# Patient Record
Sex: Female | Born: 1935 | Race: White | Hispanic: No | Smoking: Never smoker
Health system: Southern US, Community
[De-identification: ages and names within clinical notes are randomized; demographics above are authoritative.]

## PROBLEM LIST (undated history)

## (undated) DIAGNOSIS — G4733 Obstructive sleep apnea (adult) (pediatric): Secondary | ICD-10-CM

## (undated) DIAGNOSIS — E785 Hyperlipidemia, unspecified: Secondary | ICD-10-CM

## (undated) DIAGNOSIS — E039 Hypothyroidism, unspecified: Secondary | ICD-10-CM

## (undated) DIAGNOSIS — I10 Essential (primary) hypertension: Secondary | ICD-10-CM

## (undated) HISTORY — DX: Hypothyroidism, unspecified: E03.9

## (undated) HISTORY — PX: TONSILLECTOMY AND ADENOIDECTOMY: SUR1326

## (undated) HISTORY — DX: Hyperlipidemia, unspecified: E78.5

## (undated) HISTORY — PX: APPENDECTOMY: SHX54

## (undated) HISTORY — DX: Obstructive sleep apnea (adult) (pediatric): G47.33

## (undated) HISTORY — PX: PACEMAKER PLACEMENT: SHX43

## (undated) HISTORY — PX: VESICOVAGINAL FISTULA CLOSURE W/ TAH: SUR271

## (undated) HISTORY — PX: CHOLECYSTECTOMY: SHX55

---

## 2001-06-23 ENCOUNTER — Emergency Department (HOSPITAL_COMMUNITY): Admission: EM | Admit: 2001-06-23 | Discharge: 2001-06-23 | Payer: Self-pay | Admitting: *Deleted

## 2001-06-23 ENCOUNTER — Encounter: Payer: Self-pay | Admitting: Emergency Medicine

## 2001-09-13 ENCOUNTER — Ambulatory Visit (HOSPITAL_COMMUNITY): Admission: RE | Admit: 2001-09-13 | Discharge: 2001-09-14 | Payer: Self-pay | Admitting: Cardiology

## 2001-09-20 ENCOUNTER — Inpatient Hospital Stay (HOSPITAL_COMMUNITY): Admission: EM | Admit: 2001-09-20 | Discharge: 2001-10-03 | Payer: Self-pay | Admitting: *Deleted

## 2001-09-20 ENCOUNTER — Encounter: Payer: Self-pay | Admitting: *Deleted

## 2001-09-24 ENCOUNTER — Encounter: Payer: Self-pay | Admitting: Surgery

## 2001-09-25 ENCOUNTER — Encounter: Payer: Self-pay | Admitting: Surgery

## 2001-09-26 ENCOUNTER — Encounter: Payer: Self-pay | Admitting: Surgery

## 2001-09-27 ENCOUNTER — Encounter: Payer: Self-pay | Admitting: Surgery

## 2001-09-28 ENCOUNTER — Encounter: Payer: Self-pay | Admitting: Surgery

## 2001-09-29 ENCOUNTER — Encounter: Payer: Self-pay | Admitting: *Deleted

## 2001-10-18 ENCOUNTER — Ambulatory Visit (HOSPITAL_COMMUNITY): Admission: RE | Admit: 2001-10-18 | Discharge: 2001-10-18 | Payer: Self-pay | Admitting: Cardiology

## 2001-10-18 ENCOUNTER — Encounter: Payer: Self-pay | Admitting: Cardiology

## 2001-11-18 ENCOUNTER — Emergency Department (HOSPITAL_COMMUNITY): Admission: EM | Admit: 2001-11-18 | Discharge: 2001-11-18 | Payer: Self-pay | Admitting: Internal Medicine

## 2001-11-18 ENCOUNTER — Encounter: Payer: Self-pay | Admitting: Internal Medicine

## 2002-03-18 ENCOUNTER — Emergency Department (HOSPITAL_COMMUNITY): Admission: EM | Admit: 2002-03-18 | Discharge: 2002-03-18 | Payer: Self-pay | Admitting: Emergency Medicine

## 2002-03-18 ENCOUNTER — Encounter: Payer: Self-pay | Admitting: Emergency Medicine

## 2002-03-30 ENCOUNTER — Encounter: Payer: Self-pay | Admitting: Emergency Medicine

## 2002-03-30 ENCOUNTER — Emergency Department (HOSPITAL_COMMUNITY): Admission: EM | Admit: 2002-03-30 | Discharge: 2002-03-30 | Payer: Self-pay | Admitting: Emergency Medicine

## 2003-05-13 ENCOUNTER — Other Ambulatory Visit: Payer: Self-pay

## 2003-08-05 ENCOUNTER — Other Ambulatory Visit: Payer: Self-pay

## 2003-09-29 ENCOUNTER — Other Ambulatory Visit: Payer: Self-pay

## 2003-12-24 ENCOUNTER — Other Ambulatory Visit: Payer: Self-pay

## 2004-02-12 ENCOUNTER — Other Ambulatory Visit: Payer: Self-pay

## 2004-02-13 ENCOUNTER — Inpatient Hospital Stay: Payer: Self-pay | Admitting: Internal Medicine

## 2004-03-12 ENCOUNTER — Ambulatory Visit: Payer: Self-pay | Admitting: Internal Medicine

## 2004-03-31 ENCOUNTER — Other Ambulatory Visit: Payer: Self-pay

## 2004-03-31 ENCOUNTER — Emergency Department: Payer: Self-pay | Admitting: General Practice

## 2004-06-28 ENCOUNTER — Emergency Department: Payer: Self-pay | Admitting: Emergency Medicine

## 2004-07-07 ENCOUNTER — Ambulatory Visit: Payer: Self-pay | Admitting: Internal Medicine

## 2004-08-10 ENCOUNTER — Emergency Department: Payer: Self-pay | Admitting: Emergency Medicine

## 2004-08-21 ENCOUNTER — Ambulatory Visit: Payer: Self-pay | Admitting: Internal Medicine

## 2005-02-16 ENCOUNTER — Inpatient Hospital Stay: Payer: Self-pay | Admitting: Internal Medicine

## 2005-03-20 ENCOUNTER — Emergency Department: Payer: Self-pay | Admitting: Emergency Medicine

## 2005-04-04 ENCOUNTER — Emergency Department: Payer: Self-pay | Admitting: Emergency Medicine

## 2005-11-04 ENCOUNTER — Other Ambulatory Visit: Payer: Self-pay

## 2005-11-04 ENCOUNTER — Emergency Department: Payer: Self-pay | Admitting: General Practice

## 2006-02-15 ENCOUNTER — Ambulatory Visit: Payer: Self-pay | Admitting: Ophthalmology

## 2006-02-22 ENCOUNTER — Ambulatory Visit: Payer: Self-pay | Admitting: Ophthalmology

## 2006-04-26 ENCOUNTER — Other Ambulatory Visit: Payer: Self-pay

## 2006-04-26 ENCOUNTER — Emergency Department: Payer: Self-pay | Admitting: Emergency Medicine

## 2006-06-04 ENCOUNTER — Other Ambulatory Visit: Payer: Self-pay

## 2006-06-04 ENCOUNTER — Emergency Department: Payer: Self-pay

## 2006-06-09 ENCOUNTER — Ambulatory Visit: Payer: Self-pay

## 2006-06-20 ENCOUNTER — Inpatient Hospital Stay: Payer: Self-pay | Admitting: General Practice

## 2006-06-20 ENCOUNTER — Other Ambulatory Visit: Payer: Self-pay

## 2006-09-04 ENCOUNTER — Other Ambulatory Visit: Payer: Self-pay

## 2006-09-04 ENCOUNTER — Emergency Department: Payer: Self-pay | Admitting: Emergency Medicine

## 2006-09-05 ENCOUNTER — Emergency Department: Payer: Self-pay | Admitting: Emergency Medicine

## 2007-01-16 ENCOUNTER — Other Ambulatory Visit: Payer: Self-pay

## 2007-01-18 ENCOUNTER — Inpatient Hospital Stay: Payer: Self-pay | Admitting: Internal Medicine

## 2007-02-25 ENCOUNTER — Emergency Department: Payer: Self-pay | Admitting: Emergency Medicine

## 2007-02-25 ENCOUNTER — Other Ambulatory Visit: Payer: Self-pay

## 2007-05-20 ENCOUNTER — Emergency Department: Payer: Self-pay | Admitting: Emergency Medicine

## 2007-08-25 ENCOUNTER — Emergency Department: Payer: Self-pay | Admitting: Emergency Medicine

## 2007-10-01 ENCOUNTER — Emergency Department: Payer: Self-pay | Admitting: Emergency Medicine

## 2007-10-31 ENCOUNTER — Emergency Department: Payer: Self-pay | Admitting: Emergency Medicine

## 2008-03-02 ENCOUNTER — Emergency Department: Payer: Self-pay | Admitting: Internal Medicine

## 2008-04-21 ENCOUNTER — Inpatient Hospital Stay: Payer: Self-pay | Admitting: Internal Medicine

## 2008-04-24 ENCOUNTER — Inpatient Hospital Stay: Admission: RE | Admit: 2008-04-24 | Discharge: 2008-05-17 | Payer: Self-pay | Admitting: Internal Medicine

## 2008-05-10 ENCOUNTER — Ambulatory Visit (HOSPITAL_COMMUNITY): Admission: RE | Admit: 2008-05-10 | Discharge: 2008-05-10 | Payer: Self-pay | Admitting: Internal Medicine

## 2008-06-16 ENCOUNTER — Inpatient Hospital Stay: Payer: Self-pay | Admitting: Internal Medicine

## 2008-08-28 ENCOUNTER — Inpatient Hospital Stay: Payer: Self-pay | Admitting: Internal Medicine

## 2008-10-11 ENCOUNTER — Emergency Department: Payer: Self-pay | Admitting: Unknown Physician Specialty

## 2008-10-13 ENCOUNTER — Emergency Department: Payer: Self-pay | Admitting: Emergency Medicine

## 2008-10-18 ENCOUNTER — Emergency Department: Payer: Self-pay | Admitting: Emergency Medicine

## 2008-11-08 ENCOUNTER — Emergency Department: Payer: Self-pay | Admitting: Emergency Medicine

## 2008-11-13 ENCOUNTER — Emergency Department: Payer: Self-pay | Admitting: Emergency Medicine

## 2008-11-27 ENCOUNTER — Inpatient Hospital Stay: Payer: Self-pay | Admitting: Internal Medicine

## 2009-06-03 ENCOUNTER — Inpatient Hospital Stay: Payer: Self-pay | Admitting: Internal Medicine

## 2009-07-26 ENCOUNTER — Inpatient Hospital Stay: Payer: Self-pay | Admitting: Specialist

## 2009-08-28 ENCOUNTER — Emergency Department: Payer: Self-pay | Admitting: Emergency Medicine

## 2009-08-30 ENCOUNTER — Emergency Department: Payer: Self-pay | Admitting: Emergency Medicine

## 2009-09-12 ENCOUNTER — Ambulatory Visit: Payer: Self-pay | Admitting: Internal Medicine

## 2009-10-02 ENCOUNTER — Inpatient Hospital Stay: Payer: Self-pay | Admitting: Internal Medicine

## 2009-11-03 ENCOUNTER — Emergency Department: Payer: Self-pay | Admitting: Emergency Medicine

## 2009-11-07 ENCOUNTER — Emergency Department: Payer: Self-pay | Admitting: Emergency Medicine

## 2009-11-08 ENCOUNTER — Emergency Department: Payer: Self-pay | Admitting: Emergency Medicine

## 2009-12-07 ENCOUNTER — Emergency Department: Payer: Self-pay | Admitting: Emergency Medicine

## 2010-02-17 ENCOUNTER — Inpatient Hospital Stay: Payer: Self-pay | Admitting: Internal Medicine

## 2010-02-27 ENCOUNTER — Ambulatory Visit: Payer: Self-pay | Admitting: Cardiology

## 2010-04-17 ENCOUNTER — Inpatient Hospital Stay: Payer: Self-pay | Admitting: Internal Medicine

## 2010-06-06 ENCOUNTER — Encounter (HOSPITAL_COMMUNITY): Payer: Self-pay | Admitting: Radiology

## 2010-06-06 ENCOUNTER — Inpatient Hospital Stay (HOSPITAL_COMMUNITY)
Admission: EM | Admit: 2010-06-06 | Discharge: 2010-06-09 | DRG: 313 | Disposition: A | Discharge status: Home Health | Payer: Medicare Other | Attending: Internal Medicine | Admitting: Internal Medicine

## 2010-06-06 ENCOUNTER — Emergency Department (HOSPITAL_COMMUNITY): Payer: Medicare Other

## 2010-06-06 DIAGNOSIS — Z794 Long term (current) use of insulin: Secondary | ICD-10-CM

## 2010-06-06 DIAGNOSIS — Z7982 Long term (current) use of aspirin: Secondary | ICD-10-CM

## 2010-06-06 DIAGNOSIS — Z950 Presence of cardiac pacemaker: Secondary | ICD-10-CM

## 2010-06-06 DIAGNOSIS — I10 Essential (primary) hypertension: Secondary | ICD-10-CM | POA: Diagnosis present

## 2010-06-06 DIAGNOSIS — Z951 Presence of aortocoronary bypass graft: Secondary | ICD-10-CM

## 2010-06-06 DIAGNOSIS — Z79899 Other long term (current) drug therapy: Secondary | ICD-10-CM

## 2010-06-06 DIAGNOSIS — E669 Obesity, unspecified: Secondary | ICD-10-CM | POA: Diagnosis present

## 2010-06-06 DIAGNOSIS — I2510 Atherosclerotic heart disease of native coronary artery without angina pectoris: Secondary | ICD-10-CM | POA: Diagnosis present

## 2010-06-06 DIAGNOSIS — Z880 Allergy status to penicillin: Secondary | ICD-10-CM

## 2010-06-06 DIAGNOSIS — Z9181 History of falling: Secondary | ICD-10-CM

## 2010-06-06 DIAGNOSIS — E039 Hypothyroidism, unspecified: Secondary | ICD-10-CM | POA: Diagnosis present

## 2010-06-06 DIAGNOSIS — F411 Generalized anxiety disorder: Secondary | ICD-10-CM | POA: Diagnosis present

## 2010-06-06 DIAGNOSIS — Z882 Allergy status to sulfonamides status: Secondary | ICD-10-CM

## 2010-06-06 DIAGNOSIS — R0789 Other chest pain: Principal | ICD-10-CM | POA: Diagnosis present

## 2010-06-06 DIAGNOSIS — E785 Hyperlipidemia, unspecified: Secondary | ICD-10-CM | POA: Diagnosis present

## 2010-06-06 DIAGNOSIS — E1169 Type 2 diabetes mellitus with other specified complication: Secondary | ICD-10-CM | POA: Diagnosis present

## 2010-06-06 DIAGNOSIS — G4733 Obstructive sleep apnea (adult) (pediatric): Secondary | ICD-10-CM | POA: Diagnosis present

## 2010-06-06 HISTORY — DX: Essential (primary) hypertension: I10

## 2010-06-06 LAB — POCT I-STAT, CHEM 8
BUN: 16 mg/dL (ref 6–23)
Calcium, Ion: 1.1 mmol/L — ABNORMAL LOW (ref 1.12–1.32)
Chloride: 99 mEq/L (ref 96–112)
Creatinine, Ser: 1.1 mg/dL (ref 0.4–1.2)
Glucose, Bld: 67 mg/dL — ABNORMAL LOW (ref 70–99)
Hemoglobin: 10.9 g/dL — ABNORMAL LOW (ref 12.0–15.0)
Potassium: 4.1 mEq/L (ref 3.5–5.1)
Sodium: 137 mEq/L (ref 135–145)

## 2010-06-06 LAB — POCT CARDIAC MARKERS
CKMB, poc: <1 ng/mL — ABNORMAL LOW (ref 1.0–8.0)
Myoglobin, poc: 81 ng/mL (ref 12–200)

## 2010-06-06 LAB — DIFFERENTIAL
Basophils Relative: 0 % (ref 0–1)
Eosinophils Relative: 2 % (ref 0–5)
Lymphocytes Relative: 26 % (ref 12–46)
Lymphs Abs: 2.9 10*3/uL (ref 0.7–4.0)
Monocytes Absolute: 1.2 10*3/uL — ABNORMAL HIGH (ref 0.1–1.0)
Monocytes Relative: 11 % (ref 3–12)
Neutro Abs: 6.9 10*3/uL (ref 1.7–7.7)
Neutrophils Relative %: 61 % (ref 43–77)

## 2010-06-06 LAB — CBC
HCT: 33.2 % — ABNORMAL LOW (ref 36.0–46.0)
Hemoglobin: 10.8 g/dL — ABNORMAL LOW (ref 12.0–15.0)
WBC: 11.3 10*3/uL — ABNORMAL HIGH (ref 4.0–10.5)

## 2010-06-06 LAB — GLUCOSE, CAPILLARY
Glucose-Capillary: 68 mg/dL — ABNORMAL LOW (ref 70–99)
Glucose-Capillary: 82 mg/dL (ref 70–99)

## 2010-06-06 MED ORDER — IOHEXOL 300 MG/ML  SOLN
100.0000 mL | Freq: Once | INTRAMUSCULAR | Status: AC | PRN
  Administered 2010-06-06: 80 mL via INTRAVENOUS

## 2010-06-07 LAB — COMPREHENSIVE METABOLIC PANEL
ALT: 11 U/L (ref 0–35)
AST: 19 U/L (ref 0–37)
Calcium: 9.4 mg/dL (ref 8.4–10.5)
GFR calc Af Amer: >60 mL/min (ref >60)
Glucose, Bld: 178 mg/dL — ABNORMAL HIGH (ref 70–99)
Sodium: 139 mEq/L (ref 135–145)
Total Protein: 6 g/dL (ref 6.0–8.3)

## 2010-06-07 LAB — DIFFERENTIAL
Eosinophils Absolute: 0.4 10*3/uL (ref 0.0–0.7)
Eosinophils Relative: 3 % (ref 0–5)
Lymphs Abs: 3.8 10*3/uL (ref 0.7–4.0)
Monocytes Absolute: 1.1 10*3/uL — ABNORMAL HIGH (ref 0.1–1.0)

## 2010-06-07 LAB — CBC
HCT: 37.6 % (ref 36.0–46.0)
Hemoglobin: 12.7 g/dL (ref 12.0–15.0)
MCV: 95.7 fL (ref 78.0–100.0)
RBC: 3.93 MIL/uL (ref 3.87–5.11)
WBC: 11 10*3/uL — ABNORMAL HIGH (ref 4.0–10.5)

## 2010-06-07 LAB — LIPID PANEL
LDL Cholesterol: 67 mg/dL (ref 0–99)
Triglycerides: 70 mg/dL (ref <150)
VLDL: 14 mg/dL (ref 0–40)

## 2010-06-07 LAB — GLUCOSE, CAPILLARY: Glucose-Capillary: 270 mg/dL — ABNORMAL HIGH (ref 70–99)

## 2010-06-07 LAB — MAGNESIUM: Magnesium: 2.1 mg/dL (ref 1.5–2.5)

## 2010-06-07 LAB — CARDIAC PANEL(CRET KIN+CKTOT+MB+TROPI)
CK, MB: 1.2 ng/mL (ref 0.3–4.0)
Relative Index: INVALID (ref 0.0–2.5)
Total CK: 56 U/L (ref 7–177)
Total CK: 40 U/L (ref 7–177)

## 2010-06-07 LAB — HEMOGLOBIN A1C: Hgb A1c MFr Bld: 7.9 % — ABNORMAL HIGH (ref <5.7)

## 2010-06-07 LAB — MRSA PCR SCREENING: MRSA by PCR: POSITIVE — AB

## 2010-06-07 LAB — TSH: TSH: 7.176 u[IU]/mL — ABNORMAL HIGH (ref 0.350–4.500)

## 2010-06-08 ENCOUNTER — Inpatient Hospital Stay (HOSPITAL_COMMUNITY): Payer: Medicare Other

## 2010-06-08 LAB — GLUCOSE, CAPILLARY
Glucose-Capillary: 218 mg/dL — ABNORMAL HIGH (ref 70–99)
Glucose-Capillary: 274 mg/dL — ABNORMAL HIGH (ref 70–99)
Glucose-Capillary: 277 mg/dL — ABNORMAL HIGH (ref 70–99)

## 2010-06-08 MED ORDER — TECHNETIUM TC 99M TETROFOSMIN IV KIT
10.0000 | PACK | Freq: Once | INTRAVENOUS | Status: AC | PRN
  Administered 2010-06-08: 10 via INTRAVENOUS

## 2010-06-08 MED ORDER — TECHNETIUM TC 99M TETROFOSMIN IV KIT
30.0000 | PACK | Freq: Once | INTRAVENOUS | Status: AC | PRN
  Administered 2010-06-08: 30 via INTRAVENOUS

## 2010-06-09 ENCOUNTER — Other Ambulatory Visit (HOSPITAL_COMMUNITY): Payer: Self-pay

## 2010-06-13 LAB — CULTURE, BLOOD (ROUTINE X 2)
Culture: NO GROWTH
Culture: NO GROWTH

## 2010-06-14 NOTE — Discharge Summary (Signed)
Jasmine Berger, Jasmine Berger              ACCOUNT NO.:  [id number]  MEDICAL RECORD NO.:  [id number]           PATIENT TYPE:  I  LOCATION:  2011                         FACILITY:  [facility]  PHYSICIAN:  Priscella Donna I Iaan Oregel, MD      DATE OF BIRTH:  December 18, 1924  DATE OF ADMISSION:  06/06/2010 DATE OF DISCHARGE:  06/09/2010                              DISCHARGE SUMMARY   DIAGNOSES: 1. Atypical chest pain, status post a stress test which was negative. 2. Pulmonary embolism was ruled out. 3. Coronary artery disease, status post coronary artery bypass graft     in 2003. 4. History of complete heart block, status post pacemaker. 5. Insulin-dependent diabetes mellitus. 6. Obesity. 7. Status post right ankle fracture. 8. Hyperlipidemia. 9. Hypothyroidism. 10.History of chronic anxiety.  DISCHARGE MEDICATIONS: 1. Aspirin 81 mg p.o. daily. 2. Celexa 20 mg daily. 3. Duragesic patch 12.5 mcg every 3 days. 4. Hydrochlorothiazide 25 mg daily. 5. Imdur 60 mg daily. 6. Klonopin 0.5 mg everyday. 7. K-Dur 20 mEq p.o. daily. 8. Lisinopril 20 mg daily. 9. MiraLax 17 gram p.o. daily. 10.Protonix 40 mg p.o. daily. 11.Symbicort 1 puff twice daily. 12.Synthroid 175 mcg p.o. daily. 13.Thyronorm 50 mcg every 6 hours as needed. 14.Trazodone 100 mg every evening. 15.Tylenol. 16.Vitamin B12 one tab daily. 17.Vitamin D 1000 units every morning. 18.Zocor 20 mg p.o. daily.  CONSULTATIONS:  Cardiology consultation by Dr. Lynnea Ferrier.  PROCEDURES: 1. CT angio is negative for PE. 2. Chest x-ray; mediastinotomy, CABG, pacemaker, previous partial     compression fracture at the level of thoracic, age indeterminate,     myeloablative allogeneic. 3. Lexiscan Myoview reports no ischemia, EF more than 70%.  HISTORY OF PRESENT ILLNESS:  This is an 77 year old female with multiple medical problems including coronary artery disease, status post CABG; complete heart block, status post pacemaker; insulin-dependent  diabetes, who was recently moved from [facility] to [name].  She developed left-sided chest pain associated with shortness of breath, relieved with nitroglycerin received in the ambulance.  The patient admitted her pacemaker is interrogated in the last 3 to 6 months at [facility].  HOSPITAL COURSE: 1. Chest pain.  Transient episode of chest pain. 2. Cardiac enzymes were cycled, which were negative and CT angio was     negative for pulmonary embolism.  Cardiology was consulted because     of the patient's history of CABG and diabetes.  Lexiscan Myoview     was done during hospital stay, which did not show any evidence of     ischemia and EF was more than 70%, so cause of chest pain remains     atypical.  The patient has no further chest pain. 3. Diabetes mellitus.  Hemoglobin A1c found to be 7.9.  The patient     will be discharged with her insulin and further adjustment to be     done as outpatient. 4. Hypothyroidism with TSH of 7.176.  Synthroid dose was decreased to     175 mcg and further adjustment by her primary care physician.  Currently, we feel the patient is medically stable for discharge.  The patient  to resume her medications.     Katarzyna Wolven Bosie Helper, MD     HIE/MEDQ  D:  06/08/2010  T:  06/08/2010  Job:  [id number]  Electronically Signed by Ebony Cargo MD on 06/12/2010 06:55:47 PM

## 2010-06-14 NOTE — H&P (Signed)
NAME:  Jasmine Berger, Jasmine Berger              ACCOUNT NO.:  [id number]  MEDICAL RECORD NO.:  [id number]           PATIENT TYPE:  E  LOCATION:  [facility]                         FACILITY:  [facility]  PHYSICIAN:  Ty Buntrock I Aeon Kessner, MD      DATE OF BIRTH:  May 27, 1924  DATE OF ADMISSION:  06/06/2010 DATE OF DISCHARGE:                             HISTORY & PHYSICAL   CHIEF COMPLAINT:  Chest pain and shortness of breath for 1 day.  HISTORY OF PRESENT ILLNESS:  This is an 77 year old female with multiple medical problem including coronary artery disease status post CABG in 2003, complete heart block status post pacemaker, insulin- dependent diabetes mellitus, who was recently moved to [facility] after she lost her fiance in [facility].  This just happened in 2 weeks ago.  The patient today after she had hot lunch she experienced left-sided chest pain associated with shortness of breath.  The chest pain was 5/10, pain was not radiating, not associated with palpitation or sweating and as per patient currently the patient is pain free after she received nitroglycerin in the ambulance.  The patient also received aspirin 324.  Currently, the patient admitted she is pain free and no further shortness of breath.  The patient felt as if she was having heart attack.  The patient denies any similar episode in the past.  The patient admitted her pacemaker being previously interrogated every 3-6 months at [facility]  PAST MEDICAL HISTORY: 1. Insulin-dependent diabetes mellitus. 2. Hypertension. 3. Coronary artery disease status post CABG. 4. Bifascicular heart block status post permanent pacemaker. 5. Obesity. 6. Hyperlipidemia. 7. Hypothyroidism. 8. History of chronic anxiety.  PAST SURGICAL HISTORY:  Hysterectomy, appendectomy, and cholecystectomy.  SOCIA HISTORY:  She lives in the nursing home and denies any alcohol or cigarette abuse.  She has 2 children.  She is a  widow.  MEDICATIONS: 1. Melanex. 2. Zocor 20 mg. 3. Trazodone 100 mg p.o. at bedtime. 4. Klonopin 0.5 mg. 5. Symbicort. 6. Novolin N 25 units every morning. 7. Novolin 5 units at night. 8. Sertraline 200 mg by mouth every day. 9. Vitamin D 1000 units every day. 10.Vitamin B 12. 11.Oxygen continuous. 12.CPAP. 13.Potassium chloride 20 mEq. 14.Hydrochlorothiazide 25 mg. 15.Aspirin 81 mg p.o. daily. 16.Bextra 20 mg. 17.Protonix 40 mg. 18.Duragesic patch 18 mcg. 19.Lisinopril 20 mg p.o. daily. 20.Imdur 60 mg by mouth daily.  ALLERGIES33 :  PENICILLIN, PROZAC, and SULFA.  SYSTEMIC REVIEW:  All 12 points were asked and they were negative and the patient currently admitted she is feeling better and no further chest pain.  PHYSICAL EXAMINATION:  VITAL SIGNS:  Temperature 99.1, blood pressure 127/57, respiratory rate 17, and saturating 98% on room air. HEENT:  Normocephalic, atraumatic.  Pupils are equal and reactive to light and accommodation. Extraocular muscle movement was normal. NECK:  Supple.  No lymphadenopathy. HEART:  S1 and S2.  No added sound. LUNGS:  Normal vesicular breathing with equal air entry. ABDOMEN:  Soft, nontender.  Bowel sounds positive. EXTREMITIES:  Lower extremity, the patient had recent history of ankle fracture and she had a cuff in there but there  is no swelling and the patient's peripheral pulses were intact.  LABORATORY DATA:  Blood workup did show sodium 137, potassium 4.1, chloride 99, glucose 67, BUN 16, and creatinine 1.1.  White blood cells 11.3, hemoglobin 10.8, hematocrit 33.2, and platelets 257.  EKG, paced rhythm.  First set of cardiac enzyme was negative.  ASSESSMENT AND PLAN: 1. Chest pain and shortness of breath.  The patient has a history of     coronary artery bypass graft, multiple risk factor.  The patient     will be admitted to telemetry and we will place the patient on     aspirin, home medication.  We will cycle cardiac  enzyme.  We will     get 2-D echo.  We will ask Cardiology to see.  The patient may need     to have stress test during hospital stay versus cardiac     catheterization. 2. Low-grade fever with mild leukocytosis, possibly related to urinary     tract infection.  We will place the patient empirically on Cipro     IV.  We will get patient's urinalysis and culture. 3. Home medications.  The patient will review her home medications. 4. Hypoglycemia.  I will hold the Novolin N, place the patient on     Lantus 500 subcu nightly with insulin sensitive     sliding scale.  We may resume the patient's Novolin depending on     her reading during hospital stay.  Further recommendations as     hospital course progresses. 5. Deep venous thrombosis and gastrointestinal prophylaxis.     Canyon Lohr Bosie Helper, MD     HIE/MEDQ  D:  06/06/2010  T:  06/06/2010  Job:  [id number]  Electronically Signed by Ebony Cargo MD on 06/12/2010 06:55:43 PM

## 2010-07-07 ENCOUNTER — Emergency Department (HOSPITAL_COMMUNITY)
Admission: EM | Admit: 2010-07-07 | Discharge: 2010-07-07 | Disposition: A | Discharge status: Home / Self Care | Payer: Medicare Other | Source: Home / Self Care | Attending: Emergency Medicine | Admitting: Emergency Medicine

## 2010-07-07 DIAGNOSIS — Z794 Long term (current) use of insulin: Secondary | ICD-10-CM | POA: Insufficient documentation

## 2010-07-07 DIAGNOSIS — E1169 Type 2 diabetes mellitus with other specified complication: Principal | ICD-10-CM | POA: Insufficient documentation

## 2010-07-07 DIAGNOSIS — Z79899 Other long term (current) drug therapy: Secondary | ICD-10-CM | POA: Insufficient documentation

## 2010-07-07 DIAGNOSIS — R4182 Altered mental status, unspecified: Secondary | ICD-10-CM | POA: Insufficient documentation

## 2010-07-07 DIAGNOSIS — W19XXXA Unspecified fall, initial encounter: Secondary | ICD-10-CM | POA: Insufficient documentation

## 2010-07-07 DIAGNOSIS — Z950 Presence of cardiac pacemaker: Secondary | ICD-10-CM | POA: Insufficient documentation

## 2010-07-07 DIAGNOSIS — N390 Urinary tract infection, site not specified: Secondary | ICD-10-CM | POA: Insufficient documentation

## 2010-07-07 LAB — BASIC METABOLIC PANEL
BUN: 17 mg/dL (ref 6–23)
Calcium: 9.1 mg/dL (ref 8.4–10.5)
GFR calc non Af Amer: >60 mL/min (ref >60)
Glucose, Bld: 160 mg/dL — ABNORMAL HIGH (ref 70–99)
Potassium: 4.8 mEq/L (ref 3.5–5.1)
Sodium: 139 mEq/L (ref 135–145)

## 2010-07-07 LAB — URINALYSIS, ROUTINE W REFLEX MICROSCOPIC
Glucose, UA: NEGATIVE mg/dL
Nitrite: NEGATIVE
Specific Gravity, Urine: 1.013 (ref 1.005–1.030)
pH: 6.5 (ref 5.0–8.0)

## 2010-07-07 LAB — CK TOTAL AND CKMB (NOT AT ARMC)
CK, MB: 2.3 ng/mL (ref 0.3–4.0)
Relative Index: INVALID (ref 0.0–2.5)

## 2010-07-07 LAB — CBC
HCT: 35.4 % — ABNORMAL LOW (ref 36.0–46.0)
MCHC: 32.5 g/dL (ref 30.0–36.0)
Platelets: 220 10*3/uL (ref 150–400)
RDW: 12.5 % (ref 11.5–15.5)
WBC: 13.1 10*3/uL — ABNORMAL HIGH (ref 4.0–10.5)

## 2010-07-07 LAB — URINE MICROSCOPIC-ADD ON

## 2010-07-07 LAB — DIFFERENTIAL
Basophils Absolute: 0 10*3/uL (ref 0.0–0.1)
Eosinophils Absolute: 0 10*3/uL (ref 0.0–0.7)
Eosinophils Relative: 0 % (ref 0–5)
Monocytes Absolute: 1 10*3/uL (ref 0.1–1.0)

## 2010-07-07 LAB — GLUCOSE, CAPILLARY: Glucose-Capillary: 108 mg/dL — ABNORMAL HIGH (ref 70–99)

## 2010-07-07 LAB — TROPONIN I: Troponin I: 0.02 ng/mL (ref 0.00–0.06)

## 2010-07-08 ENCOUNTER — Observation Stay (HOSPITAL_COMMUNITY)
Admission: EM | Admit: 2010-07-08 | Discharge: 2010-07-08 | Disposition: A | Discharge status: Home / Self Care | Payer: Medicare Other | Attending: Emergency Medicine | Admitting: Emergency Medicine

## 2010-07-08 LAB — GLUCOSE, CAPILLARY
Glucose-Capillary: 88 mg/dL (ref 70–99)
Glucose-Capillary: 95 mg/dL (ref 70–99)
Glucose-Capillary: 50 mg/dL — ABNORMAL LOW (ref 70–99)
Glucose-Capillary: 141 mg/dL — ABNORMAL HIGH (ref 70–99)

## 2010-07-08 LAB — POCT I-STAT, CHEM 8
BUN: 15 mg/dL (ref 6–23)
Chloride: 104 mEq/L (ref 96–112)
Creatinine, Ser: 0.9 mg/dL (ref 0.4–1.2)
Sodium: 141 mEq/L (ref 135–145)

## 2010-08-17 LAB — GLUCOSE, CAPILLARY
Glucose-Capillary: 120 mg/dL — ABNORMAL HIGH (ref 70–99)
Glucose-Capillary: 134 mg/dL — ABNORMAL HIGH (ref 70–99)
Glucose-Capillary: 160 mg/dL — ABNORMAL HIGH (ref 70–99)
Glucose-Capillary: 168 mg/dL — ABNORMAL HIGH (ref 70–99)
Glucose-Capillary: 173 mg/dL — ABNORMAL HIGH (ref 70–99)
Glucose-Capillary: 182 mg/dL — ABNORMAL HIGH (ref 70–99)
Glucose-Capillary: 190 mg/dL — ABNORMAL HIGH (ref 70–99)
Glucose-Capillary: 190 mg/dL — ABNORMAL HIGH (ref 70–99)
Glucose-Capillary: 192 mg/dL — ABNORMAL HIGH (ref 70–99)
Glucose-Capillary: 201 mg/dL — ABNORMAL HIGH (ref 70–99)
Glucose-Capillary: 209 mg/dL — ABNORMAL HIGH (ref 70–99)
Glucose-Capillary: 212 mg/dL — ABNORMAL HIGH (ref 70–99)
Glucose-Capillary: 212 mg/dL — ABNORMAL HIGH (ref 70–99)
Glucose-Capillary: 214 mg/dL — ABNORMAL HIGH (ref 70–99)
Glucose-Capillary: 227 mg/dL — ABNORMAL HIGH (ref 70–99)
Glucose-Capillary: 230 mg/dL — ABNORMAL HIGH (ref 70–99)
Glucose-Capillary: 230 mg/dL — ABNORMAL HIGH (ref 70–99)
Glucose-Capillary: 231 mg/dL — ABNORMAL HIGH (ref 70–99)
Glucose-Capillary: 239 mg/dL — ABNORMAL HIGH (ref 70–99)
Glucose-Capillary: 245 mg/dL — ABNORMAL HIGH (ref 70–99)
Glucose-Capillary: 256 mg/dL — ABNORMAL HIGH (ref 70–99)
Glucose-Capillary: 271 mg/dL — ABNORMAL HIGH (ref 70–99)
Glucose-Capillary: 272 mg/dL — ABNORMAL HIGH (ref 70–99)
Glucose-Capillary: 273 mg/dL — ABNORMAL HIGH (ref 70–99)
Glucose-Capillary: 278 mg/dL — ABNORMAL HIGH (ref 70–99)
Glucose-Capillary: 345 mg/dL — ABNORMAL HIGH (ref 70–99)
Glucose-Capillary: 84 mg/dL (ref 70–99)

## 2010-08-24 ENCOUNTER — Encounter: Payer: Self-pay | Admitting: Pulmonary Disease

## 2010-08-24 ENCOUNTER — Ambulatory Visit (INDEPENDENT_AMBULATORY_CARE_PROVIDER_SITE_OTHER): Payer: Medicare FFS | Admitting: Pulmonary Disease

## 2010-08-24 VITALS — BP 150/80 | HR 70 | Temp 98.4°F | Ht 62.0 in | Wt 172.0 lb

## 2010-08-24 DIAGNOSIS — Z9989 Dependence on other enabling machines and devices: Secondary | ICD-10-CM

## 2010-08-24 DIAGNOSIS — G4733 Obstructive sleep apnea (adult) (pediatric): Secondary | ICD-10-CM | POA: Insufficient documentation

## 2010-08-24 NOTE — Patient Instructions (Signed)
We will take care of the paperwork for your CPAP machine after your sleep study

## 2010-08-24 NOTE — Assessment & Plan Note (Addendum)
obstructive sleep apnea is very likely & an overnight polysomnogram will be scheduled as a split study. The pathophysiology of obstructive sleep apnea , it's cardiovascular consequences & modes of treatment including CPAP were discused with the patient in detail & they evidenced understanding. We will send a copy of the study to her supplier & make changes to CPPA as needed We will also assess need for O2.

## 2010-08-24 NOTE — Progress Notes (Signed)
  Subjective:    Patient ID: Jasmine Berger, female    DOB: April 17, 1925, 77 y.o.   MRN: [id number]  HPI 77/F ,Assisted living reisdent presents for evaluation of obstructive sleep apnea  Had PSG 10 yrs ago at [facility] , AHI  'big number ', on CPAP since - replaced 1 yr ago, with full face mask , with 2 L O2 (based on ONO), DME- [facility] put her on symbicort  Takes 1 puff at night Takes celexa for depression & clonazpam 0.5 mg qd, fentanyl 12 mcg patch for arthritis pain ESS 6 Bedtime is 10pm, latency few minutes, 3-4 BR visits, oob at 0530, compliant with cpap, denies excessive somnolence or unintentional naps, no morning headaches.   Review of Systems  Constitutional: Positive for unexpected weight change. Negative for fever.  HENT: Positive for congestion, rhinorrhea and sinus pressure. Negative for ear pain, nosebleeds, sore throat, sneezing, trouble swallowing, dental problem and postnasal drip.   Eyes: Positive for itching. Negative for redness.  Respiratory: Positive for cough and shortness of breath. Negative for chest tightness and wheezing.   Cardiovascular: Positive for leg swelling. Negative for palpitations.  Gastrointestinal: Negative for nausea and vomiting.  Genitourinary: Negative for dysuria.  Musculoskeletal: Positive for joint swelling.  Skin: Negative for rash.  Neurological: Positive for headaches.  Hematological: Bruises/bleeds easily.  Psychiatric/Behavioral: Negative for dysphoric mood. The patient is not nervous/anxious.        Objective:   Physical Exam Gen. Pleasant, elderly,well-nourished, in no distress, normal affect, ambulates with walker ENT - no lesions, no post nasal drip Neck: No JVD, no thyromegaly, no carotid bruits Lungs: no use of accessory muscles, no dullness to percussion, clear without rales or rhonchi  Cardiovascular: Rhythm regular, heart sounds  normal, no murmurs or gallops, no peripheral edema Abdomen: soft and non-tender, no  hepatosplenomegaly, BS normal. Musculoskeletal: No deformities, no cyanosis or clubbing Neuro:  alert, non focal         Assessment & Plan:

## 2010-09-03 ENCOUNTER — Ambulatory Visit (HOSPITAL_BASED_OUTPATIENT_CLINIC_OR_DEPARTMENT_OTHER): Payer: Medicare FFS

## 2010-09-07 ENCOUNTER — Telehealth: Payer: Self-pay | Admitting: Pulmonary Disease

## 2010-09-07 NOTE — Telephone Encounter (Signed)
lmomtcb x1 for [employer]

## 2010-09-08 NOTE — Telephone Encounter (Signed)
lmomtcb x 2  

## 2010-09-09 NOTE — Telephone Encounter (Signed)
lmomtcb x 3  

## 2010-09-10 NOTE — Telephone Encounter (Signed)
We have attempted to contact x 3.  Per protocol, will sign off on this message and wait for Gaylyn Rong to call us back.

## 2010-09-18 NOTE — Consult Note (Signed)
[place]  Patient:    MACALA, BALDONADO Visit Number: [id number] MRN: [id number]          Service Type: MED Location: 2300 2310 01 Attending Physician:  Cleatrice Burke Dictated by:   Nathen May, M.D., [facility] Admit Date:  09/20/2001   CC:         Kari Baars, M.D.  [facility]  at [facility]  [place] attn. Device Clinic  Alleen Borne, M.D.   Consultation Report  REASON FOR CONSULTATION:  Thank you very much for asking me to see Mrs. Anastasio Champion in electrophysiological consultation for sinus bradycardia.  HISTORY OF PRESENT ILLNESS:  Mrs. Gorniak is a 77 year old widowed mother of two who has a complicated past psychiatric history.  She presented approximately two weeks ago to Dr. Maisie Fus C. Wall for evaluation of chest pain.  She was referred for catheterization that demonstrated patency at her prior PCI sites with a 70% proximal LAD, 70% distal lesion, 70% diagonal, 80% ramus, and the RCA was diffusely diseased.  There was also further disease in the circumflex as well.  LV function per Dr. Lestine Box addendum is normal.  At that time, CABG was recommended with PCI as an alternative.  The patient declined and was discharged on medical therapy.  Because of her refractory symptoms, she was readmitted to the hospital on Sep 20, 2001.  CVTS consultation has been obtained and CABG is anticipated.  However, while monitoring, the patient has had significant problems with sinus node dysfunction, pauses of two to three seconds associated with heart rates in the low 30s.  Some of these are related to blocked PACs and many of these otherwise represent simply sinus node slowing and/or arrest.  It does not appear to be sinus node exit block.  The patient has a complex related history of recurrent syncope.  This has occurred multiple times including twice in the last five months.  One of these was  preceded by micturition.  The other she does not recall, and in her prior assisted care facility, she had multiple episodes that occurred after she stood.  She has had episodes of prolonged unconsciousness.  This has particularly occurred after her husband died.  She recalls not remembering three days of event.  It was not clear to her physician whether this represented seizures or a psychogenic response to stress.  Her functional capacity is quite limited.  She lives in a nursing home and walks around a little bit.  She does not have resting shortness of breath.  PAST MEDICAL HISTORY:  Notable for hypertension and diabetes.  She is significant obese.  She has hyperlipidemia and has treated hypothyroidism. She also has chronic anxiety and takes anxiolytics frequently.  PAST SURGICAL HISTORY:  Notable for hysterectomy, appendectomy, and cholecystectomy.  SOCIAL HISTORY:  She lives in a nursing facility.  She denies any alcohol or cigarettes.  She has two children.  She is widowed.  She has a bird named Clinical biochemist.  FAMILY HISTORY:  Noncontributory.  REVIEW OF SYSTEMS:  Outlined on the intake note on Sep 20, 2001.  It is reviewed and is not expanded upon at this point.  MEDICATIONS:  1. Insulin.  2. Protonix 40 mg.  3. Synthroid 150 mcg.  4. Clonazepam 1 q.i.d.  5. Aspirin.  6. Somatostatin 40 mg q.d.  7. Celexa 40 mg q.d.  8. Avapro 300/25.  9. Amlodipine 5. 10. Ambien p.r.n. 11. Heparin IV.  ALLERGIES:  She is allergic to PENICILLIN, PROZAC, and SULFA.  PHYSICAL EXAMINATION:  GENERAL:  She is an elderly obese Caucasian female in no acute distress.  VITAL SIGNS:  Her blood pressure is in the range of 95 to 130 systolic with a diastolic of approximately 50.  Her heart rate ranges from the mid-30s to the mid-70s.  Her respirations are 18 and unlabored.  HEENT:  No scleral icterus nor xanthoma.  NECK:  Her neck veins were difficult to discern.  Her carotids were  brisk bilaterally without bruits.  BACK:  Without kyphosis or scoliosis.  LUNGS:  Clear.  HEART:  Heart sounds were regular.  Murmurs and gallops were not appreciated.  ABDOMEN:  Soft.  Protuberant.  Bowel sounds were present.  Organomegaly was not appreciated.  EXTREMITIES:  Without clubbing, cyanosis, or edema.  NEUROLOGICAL:  Grossly normal.  SKIN:  Warm and dry.  LABORATORY DATA:  Electrocardiogram demonstrates sinus rhythm at 60 with intervals of 0.19, 0.14, 0.51 with an access of -70 degrees.  Telemetry is as noted above.  IMPRESSION: 1. Sinus node dysfunction with pauses of greater than two seconds with heart    rates around 30 intermittently and persisting for minutes. 2. Coronary artery disease.    a. Three vessel.    b. Good left ventricular function.    c. Persistent chest pain now for coronary artery bypass graft. 3. History of syncope-orthostatic/postmicturation with other episodes of    transient loss of consciousness which may or may not be cardiovascular. 4. Right bundle branch block, left axis deviation. 5. Prior history of significant psychosocial stress with two hospitalizations    for depression including ECG and chronic anxiety. 6. Diabetes and hypertension for many years.  DISPOSITION:  Mrs. Watanabe has coronary artery disease with anticipated bypass surgery.  She had significant sinus node dysfunction and there are no drugs easily able to be implicated as the cause of this.  Given the episodes of heart rate in the 30s, even during waking hours, if these persists following surgery, I would recommend pacemaker implantation.   It is unlikely that this represents an ischemic manifestation but it would certainly be nice if revascularization were to assist [place] with this.  We will follow her.  Procedurally, her surgery unfortunately is not scheduled until next Wednesday.  Thank you very much for this consultation. Dictated by:   Nathen May,  M.D., [facility] Attending Physician:  Cleatrice Burke DD:  09/22/01 TD:  09/25/01 Job: [id number]

## 2010-09-18 NOTE — Op Note (Signed)
[place]. [facility]  Patient:    Jasmine Berger, Jasmine Berger Visit Number: [id number] MRN: [id number]          Service Type: MED Location: 2300 2310 01 Attending Physician:  Cleatrice Burke Dictated by:   Rudene Christians. Ladona Ridgel, M.D. [facility] Proc. Date: 09/28/01 Admit Date:  09/20/2001   CC:         Alleen Borne, M.D.  Kari Baars, M.D.  Kathrine Cords, R.N.  Luis Abed, M.D. [facility]   Operative Report  PROCEDURE PERFORMED:  Implantation of a dual-chamber pacemaker.  INDICATIONS:  Complete heart block.  I. Introduction.  The patient is a very pleasant 77 year old woman who has a history of bradycardia and underwent coronary artery bypass surgery for severe three-vessel coronary disease.  Postoperatively, she had initially atrial fibrillation with long pauses and then returned to sinus rhythm but had no underlying AV conduction, and she is now referred for permanent pacemaker implantation.  II. After informed consent was obtained, the patient was taken to the diagnostic KP lab in the fasting state.  After the usual preparation and draping, 30 cc of lidocaine was infiltrated into the left infraclavicular region.  A 7 cm incision was carried out over this region, and electrocautery was utilized to dissect down to the subpectoralis fascia.  20 cc of contrast was injected into the left upper extremity, demonstrating a patent left subclavian vein.  It was subsequently punctured x2, and the Medtronic model 5076 52 cm active fixation lead, serial #[id number] was inserted into the right ventricle, and the Medtronic model 5076 45 cm active fixation lead, serial #[id number] was advanced into the right atrium.  Mapping was carried out in the right ventricle, and the loop was actually fixed.  The final site R waves which were paced measured approximately 11 mV.  The patients threshold was 0.6 volts at 0.5 msec after the lead was actively fixed in the pacing impedance was  688 ohms.  With the ventricular lead satisfactorily positioned, mapping was carried out with the right atrial lead, and at the final site on the lateral wall at the right atrium, P waves measured 4 mV, and the pacing threshold was 1.3 volts or 0.5 msec with a pacing impedance of 900 ohms.  With the lead in satisfactory position, they were secured to the subpectoralis fascia with a figure-of-eight silk suture.  In addition, the sew-in sleeves were secured with silk suture.  Electrocautery was utilized to make a subcutaneous pocket.  At this point, the Medtronic Sigma [id number], dual-chamber pacemaker, serial [id number], was connected to the atrial and ventricular pacing leads and placed in the subcutaneous pocket. Kanamycin irrigation was utilized to irrigate the incision with fore and after pocket insertion.  The generator was secured with a silk suture.  The incision was then closed with a layer of 2-0 Vicryl, followed by a layer of 3-0 Vicryl, followed by a layer of 4-0 Vicryl.  Benzoin was painted on the skin.  Steri-Strips were applied, and a pressure dressing was in place, and the patient returned to her room in satisfactory condition.  III. Complications.  There were no immediate procedure complications.  IV. Results. This demonstrated successful implantation of the Medtronic dual-chamber pacing system with the patient with complete heart block and no underlying escape. Dictated by:   Rudene Christians. Ladona Ridgel, M.D. [facility] Attending Physician:  Cleatrice Burke DD:  09/28/01 TD:  09/29/01 Job: [id number]

## 2010-09-18 NOTE — Consult Note (Signed)
[place]. [facility]  Patient:    Jasmine Berger, Jasmine Berger Visit Number: [id number] MRN: [id number]          Service Type: MED Location: CCUB 2903 01 Attending Physician:  Talitha Givens Dictated by:   Alleen Borne, M.D. Proc. Date: 09/21/01 Admit Date:  09/20/2001   CC:         Luis Abed, M.D. [facility]   Consultation Report  REFERRING PHYSICIAN:  Luis Abed, M.D.  REASON FOR CONSULTATION:  Severe three-vessel coronary artery disease with unstable angina.  HISTORY OF PRESENT ILLNESS:  This patient is a 77 year old white female with a history of hypertension, diabetes, and hyperlipidemia as well as coronary artery disease status post two percutaneous interventions in the past. She recently underwent cardiac catheterization on Sep 13, 2001 after she presented with recurrent chest pain which showed severe three-vessel disease. The LAD had 70% proximal stenosis. There was 70% diagonal stenosis. There was 80% intermediate stenosis. The left circumflex had 70-80% stenosis. The right coronary artery was diffusely diseased up to 80%. Left ventricular function was well-preserved. At that time a coronary artery bypass graft surgery was recommended, but the patient declined surgery or percutaneous intervention. She has been at home at the [facility] where she lives in an assisted care situation. She reports having recurrent episodes of substernal chest pressure and heaviness associated with dyspnea. The episodes are moderately severe, and she has taken sublingual nitroglycerin with temporary relief. She has had progressive fatigue. She was admitted yesterday and ruled out for myocardial infarction.  PAST MEDICAL HISTORY:  1. Coronary artery disease as mentioned above. She is status post PTCA 15     years ago and two years ago.  2. History of hyperlipidemia.  3. History of hypertension.  4. History of diabetes x24 years.  5. History  of hypothyroidism.  6. History of syncope and has been told that she was having a seizure by     neurology.  7. History of hysterectomy.  8. History of appendectomy.  9. History of cholecystectomy.  HOME MEDICATIONS:  1. Humulin R 8 units q.a.m..  2. Humulin L 30 units q.a.m. and q.p.m.  3. Imdur 60 mg q.d.  4. Prilosec 20 mg q.d.  5. Levoxyl 150 mcg q.d.  6. Celexa 40 mg q.d.  7. Ambien 5 mg q.h.s.  8. Clonazepam 1 mg q.i.d.  9. Avalide 300/12.5 one q.d. 10. Aspirin one q.d. 11. Lipitor 20 mg q.d. 12. Plavix 75 mg q.d. 13. Bextra 10 mg q.d. 14. Sublingual nitroglycerin p.r.n.  REVIEW OF SYSTEMS:  CONSTITUTIONAL:  She denies fevers or chills. She has had no recent weight changes. EYES:  Negative. ENT:  Negative. ENDOCRINE:  She has diabetes and hypothyroidism. CARDIOVASCULAR:  As above. She has had chest pain and exertional dyspnea. She denies PND and orthopnea. She has had no peripheral edema. She has had occasional palpitations. RESPIRATORY:  She denies cough and sputum production. GI:  She had had no nausea and vomiting. She denies melena and bright red blood per rectum. GU:  She denies dysuria and hematuria. NEUROLOGICAL:  She has had no focal weakness or numbness. She has had a couple episodes of syncope over the past five months and has been followed by neurology for that. She was told that they may be due to seizure activity and started on medication for that without resolution. PSYCHIATRIC: She has a history of depression and anxiety. SKIN:  Negative. MUSCULOSKELETAL: She does have  some arthralgias.  SOCIAL HISTORY:  She lives at [facility] in an assisted care situation. She does not smoke and does not drink alcohol. She has two children and is widowed.  FAMILY HISTORY:  Positive for coronary artery disease with her father dying of congestive heart failure at age 23. Her mother died of stroke at age 18. She has two brothers that are deceased and one  had coronary artery bypass surgery.  PHYSICAL EXAMINATION:  VITAL SIGNS:  Blood pressure 107/53 and her pulse is 53 and regular, respiratory rate is 18 and nonlabored.  GENERAL:  She is an obese white female in no distress.  HEENT:  Normocephalic, atraumatic. The pupils are equal and reactive to light and accommodation. Extraocular muscles are intact. Her throat is clear.  NECK:  Normal carotid pulses bilaterally. There are no bruits. There is no adenopathy or thyromegaly.  CARDIAC:  Regular rate and rhythm with normal heart sounds. There are no murmurs, rubs or gallops.  LUNGS:  Clear.  ABDOMEN:  Active bowel sounds. Her abdomen is soft and obese. There are no palpable masses or organomegaly.  EXTREMITIES:  No peripheral edema. Pedal pulses are not palpable.  SKIN:  Warm and dry.  NEUROLOGICAL:  Alert and oriented x3. Motor and sensory exam is grossly normal.  LABORATORY AND ACCESSORY DATA:  Normal electrolytes with a glucose of 127, BUN of 15 and a creatinine of 1.0. Coagulation profile is normal. White blood cell count is 11.7 with a hemoglobin of 12.9 and platelet count of 269,000.  Chest x-ray showed cardiac enlargement with no active disease. Electrocardiogram showed normal sinus rhythm with a right bundle branch block and left anterior fascicular block. 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  IMPRESSION:  This patient has severe three-vessel coronary disease with unstable anginal symptoms that are progressive and marginally limiting her lifestyle. I agree that proceeding with coronary artery bypass graft surgery is the best treatment to prevent further ischemia and infarction. I have discussed the operative procedure with her including alternatives, benefits, and risks including bleeding, blood transfusion, infection, stroke, myocardial infarction, graft failure, and death. She understands and agrees to proceed.  She has been on Plavix and aspirin, and I would like to  discontinue the Plavix and wait until Wednesday of next week to perform surgery to minimize her bleeding complications and need for blood transfusion. If she develops recurrent chest pain symptoms, then we may need to proceed with surgery sooner. We will plan to obtain carotid Dopplers and lower extremity arterial Dopplers tomorrow. Dictated by:   Alleen Borne, M.D. Attending Physician:  Talitha Givens DD:  09/21/01 TD:  09/24/01 Job: [id number]

## 2010-09-18 NOTE — Cardiovascular Report (Signed)
[place]. [facility]  Patient:    Jasmine Berger, Jasmine Berger Visit Number: [id number] MRN: [id number]          Service Type: CAT Location: 3700 [id number] Attending Physician:  Ronaldo Miyamoto Dictated by:   Arturo Morton Riley Kill, M.D. [facility] Proc. Date: 09/13/01 Admit Date:  09/13/2001 Discharge Date: 09/14/2001   CC:         Kari Baars, M.D.  Thomas C. Wall, M.D. [facility]  CV Laboratory   Cardiac Catheterization  PROCEDURE:  Cardiac catheterization.  CARDIOLOGIST:  Arturo Morton. Riley Kill, M.D. [facility]  INDICATIONS:  Ms. Forstrom is a 77 year old who lives in assisted living facility.  She has had a history of known coronary artery disease with prior catheterization in [place], [place].  She presents now with chest pain and exertional fatigue.  Current study is done to assess coronary anatomy.  PROCEDURES: 1. Left heart catheterization. 2. Selective coronary arteriography. 3. Selective left ventriculography. 4. Subclavian angiography.  DESCRIPTION OF PROCEDURE:  The procedure was performed from the right femoral artery using 6 French catheter.  She tolerated the procedure without complications.  She was taken to the holding area in satisfactory condition.  HEMODYNAMIC DATA:  Central aortic pressure 132/56.  Left ventricle 143/1/13. No significant gradient on pullback across the aortic valve.  There was an initial gradient and then when rechecked was no longer present.  It was easy to cross the valve in an antegrade fashion.  ANGIOGRAPHIC DATA:  The left main coronary artery is free of critical disease. disease.  The left anterior descending artery is segmentally diseased in the proximal portion overlapping the origin of the diagonal.  There is about 70-75% narrowing leading into the diagonal.  The diagonal itself has about 70% ostial narrowing.  There is a 70% stenosis distally, and there is some typical diabetic changes in the distal vessel.  The  diagonal and LAD both appear to be graftable.  The ramus intermedius is apparently a large vessel with diffuse segmental 80% narrowing proximally.  The circumflex provides a marginal branch which has about 40% ostial narrowing.  The AV circumflex distally has tandem lesions of 70-80%.  The right coronary artery has a marked anterior takeoff.  There was about 50-60% narrowing at the ostium followed by tandem lesions of about 75 or 80% in the proximal portion of the mid vessel.  Distally, there is some diffuse disease with 50% tandem lesions, and the proximal portion of the PDA has some ostial disease that appears to be about 80%.  The posterior lateral system has some luminal irregularities but is without critical disease.  The origin of the subclavian does have about 30% narrowing.  The internal mammary is patent.  VENTRICULOGRAPHY:  The ventriculography in the RAO projection reveals vigorous global systolic function.  There is trace mitral regurgitation noted.  CONCLUSIONS: 1. Preserved overall left ventricular function. 2. Progression of disease involving the left anterior descending,    intermediate, and right coronary artery as described in the above    text. 3. Patent subclavian and internal mammary.  DISPOSITION:  We plan to get a surgical consult.  The patient may require revascularization surgery as she is progressive symptomatic. Dictated by:   Arturo Morton Riley Kill, M.D. [facility] Attending Physician:  Ronaldo Miyamoto DD:  09/13/01 TD:  09/14/01 Job: [id number]

## 2010-09-18 NOTE — Op Note (Signed)
[place]. [facility]  Patient:    Jasmine Berger, Jasmine Berger Visit Number: [id number] MRN: [id number]          Service Type: MED Location: 2300 2310 01 Attending Physician:  Cleatrice Burke Dictated by:   Alleen Borne, M.D. Proc. Date: 09/25/01 Admit Date:  09/20/2001   CC:         Cath lab  [facility]   Operative Report  PREOPERATIVE DIAGNOSIS:  Severe three-vessel coronary artery disease with unstable angina.  POSTOPERATIVE DIAGNOSIS:  Severe three-vessel coronary artery disease with unstable angina.  OPERATION PERFORMED:  Median sternotomy, extracorporeal circulation, coronary artery bypass graft surgery times six using a left internal mammary artery graft to the left anterior descending coronary artery, with a saphenous vein graft to the diagonal branch of the left anterior descending, a sequential saphenous vein graft to the intermediate, first obtuse marginal, and second obtuse marginal branches of the left circumflex coronary artery, and a saphenous vein graft to the posterior descending coronary artery.  Endovein harvest from the right thigh.  SURGEON:  Alleen Borne, M.D.  ASSISTANT: 1. Salvatore Decent. Cornelius Moras, M.D. 2. Adair Patter, P.A.  ANESTHESIA:  General endotracheal.  INDICATIONS FOR PROCEDURE:  The patient is a 77 year old white female with a history of diabetes mellitus and hypertension as well as a history of coronary disease status post two remote percutaneous interventions.  Recent catheterization on Sep 13, 2001 showed severe three-vessel coronary artery disease and coronary artery bypass surgery was recommended.  The patient declined at that time and was treated medically.  She is now readmitted with unstable angina and dyspnea.  She ruled out for myocardial infarction.  After review of the catheterization films, it was felt that coronary artery bypass surgery was the best treatment for the patient.  I discussed the  operative procedure with the patient and her daughter including alternatives to surgery, benefits, and risks including bleeding, possible blood transfusion, infection, stroke, myocardial infarction, and death.  They understood and agreed to proceed with surgery.  DESCRIPTION OF PROCEDURE:  The patient was taken to the operating room and placed on the table in supine position.  After induction of general endotracheal anesthesia, a Foley catheter was placed in the bladder using sterile technique.  Then the chest, abdomen and both lower extremities were prepped and draped in the usual sterile manner.  The chest was entered through a median sternotomy incision and the pericardium opened in the midline. Examination of the heart showed good ventricular contractility.  The ascending aorta had no palpable plaques in it.  Then the left internal mammary artery was harvested from the chest wall as a pedicle graft.  This was a medium caliber vessel with excellent blood flow through it.  At the same time a segment of greater saphenous vein was harvested from the right thigh using Endovein harvest technique.  This vein was of medium caliber and good quality.  In addition, a piece of vein was harvested from the right leg below the knee using open technique.  This vein was also of medium size and good quality.  Then the patient was heparinized and when an adequate activated clotting time was achieved, the distal ascending aorta was cannulated using a 20 French aortic cannula for arterial inflow.  Venous outflow was achieved using a two-stage venous cannula through the right atrial appendage.  An antegrade cardioplegia and vent cannula was inserted into the aortic root.  The patient was placed on cardiopulmonary bypass and the  distal coronary arteries were identified.  The LAD was a large graftable vessel.  The diagonal branch was a medium-sized graftable vessel.  The intermediate was a large vessel.   The first marginal was medium-sized and the second marginal was a smaller vessel.  All three of these were graftable.  The posterior descending branch was a medium-sized graftable vessel.  There were a couple of posterolateral branches which were small and not graftable.  Then the aorta was cross-clamped and 500 cc of cold blood antegrade cardioplegia was administered in the aortic root with quick arrest of the heart.  Systemic hypothermia to 20 degrees centigrade and topical hypothermia with iced saline was used.  A temperature probe was placed in the septum and an insulating pad in the pericardium.  The first distal anastomosis was performed to the intermediate coronary artery.  The internal diameter of this vessel was about 2 mm.  The conduit used was a segment of greater saphenous vein.  Anastomosis was performed in a sequential side-to-side manner using continuous 7-0 Prolene suture.  The flow was measured through the graft and was excellent.  A second distal anastomosis was performed to the first marginal branch.  The internal diameter follow-up this vessel was 1.6 mm.  The conduit used was the same segment of the greater saphenous vein.  Anastomosis was performed in a sequential side-to-side manner using continuous 7-0 Prolene suture.  The flow was measured through the graft and was excellent.  The third distal anastomosis was performed to the second marginal branch.  The internal diameter was 1.5 mm.  The conduit used was the same segment of greater saphenous vein.  The anastomosis was performed in a sequential end-to-side manner using continuous 7-0 Prolene suture.  The flow was measured through the graft and was excellent.  Then another dose of cardioplegia was given down the vein graft and in the aortic root.  The fourth distal anastomosis was performed to the diagonal branch.  The internal diameter was 1.6 mm.  The conduit used was a second  segment of greater saphenous  vein.  The anastomosis was performed in an end-to-side manner using continuous 7-0 Prolene suture.  The flow was measured through the graft and was excellent.  The fifth distal anastomosis was performed to the posterior descending branch of the right coronary artery.  The internal diameter was 1.6 mm.  The conduit used was a third segment of greater saphenous vein.  The anastomosis was performed in an end-to-side manner using continuous 7-0 Prolene suture.  The flow was measured through the graft and was excellent.  Then another dose of cardioplegia was given down the vein grafts and in the aortic root.  The sixth distal anastomosis was performed to the midportion of the left anterior descending coronary artery.  The internal diameter was 2.5 mm.  The conduit used was the left internal mammary artery graft and this was brought through an opening in the left pericardium anterior to the phrenic nerve.  It was anastomosed to the LAD in an end-to-side manner using continuous 8-0 Prolene suture.  The pedicle was tacked to the epicardium with 6-0 Prolene sutures.  The patient was rewarmed to 37 degrees and the clamp removed from the mammary pedicle.  There was rapid warming of the ventricular septum.  The crossclamp was removed with a time of 80 minutes.  The patient returned to sinus arrest. The proximal and distal anastomoses appeared hemostatic and line of the grafts satisfactory.  Graft markers were placed  around the proximal anastomoses.  Two temporary right ventricular and right atrial pacing wires were placed and brought out through the skin.  The patient was placed in the DDD mode at 90 beats per minute.  When the patient had rewarmed to 37 degrees centigrade, she was weaned from cardiopulmonary bypass on low dose dopamine.  Total bypass time was 126 minutes.  Cardiac function appeared good with cardiac output of 4.5L per minute.  Protamine was given and the venous and aortic  cannulas were removed without difficulty.  Hemostasis was achieved.  Three chest tubes were placed with a tube in the posterior pericardium and one in the left pleural space and one in the anterior mediastinum.  The pericardium was loosely reapproximated over the heart.  The sternum was closed with #6 stainless steel wires.  The fascia was closed with continuous #1 Vicryl suture.  The subcutaneous tissues were closed using continuous 2-0 Vicryl and the skin with 3-0 Vicryl subcuticular closure.  The lower extremity vein harvest site was closed in layers in a similar manner.  The sponge, needle and instrument counts were correct according to the scrub nurse.  A dry sterile dressing was applied over the incisions and around the chest tubes which were hooked to Pleur-evac suction.  The patient remained hemodynamically stable and was transported to the SICU in guarded but stable condition. Dictated by:   Alleen Borne, M.D. Attending Physician:  Cleatrice Burke DD:  09/25/01 TD:  09/25/01 Job: [id number]

## 2010-09-18 NOTE — Discharge Summary (Signed)
[place]. [facility]  Patient:    HILLARY, STRUSS Visit Number: [id number] MRN: [id number]          Service Type: MED Location: 2000 2004 01 Attending Physician:  Cleatrice Burke Dictated by:   Adair Patter, P.A. Admit Date:  09/20/2001 Disc. Date: 10/03/01   CC:         CVTS office  [facility]  Kari Baars, M.D.   Discharge Summary  DATE OF BIRTH:  June 25, 1924  ADMITTING DIAGNOSIS:  Chest pain.  SECONDARY DIAGNOSES: 1. Insulin-dependent diabetes mellitus. 2. Bifascicular heart block.  DISCHARGE DIAGNOSIS:  Coronary artery disease.  HOSPITAL COURSE:  The patient was admitted to [facility] on Sep 20, 2001, secondary to experiencing chest pain.  The patient had a known history of coronary artery disease and diabetes mellitus.  She underwent cardiac workup.  This included cardiac catheterization which revealed very significant coronary artery disease.  Because of this, Dr. Laneta Simmers was consulted.  On Sep 25, 2001, the patient underwent a coronary artery bypass graft x6 with a left internal mammary artery anastomosed to the left anterior descending artery, a saphenous vein graft to the diagonal artery, a saphenous vein graft to the posterior descending artery, and sequential saphenous vein graft to the intermedius, obtuse marginal #1, and obtuse marginal #2 arteries.  No complications were noted during the procedure.  Postoperatively, the patient was found to have complete heart block.  This deteriorated from bifascicular block that she had preoperatively.  Because of her complete heart block, the patient was scheduled for permanent pacemaker insertion.  The patient subsequently had permanent pacemaker placed on Sep 28, 2001, without difficulty.  The remainder of the hospital course remained uneventful.  Her hemoglobin and hematocrit were stable at 8.6 and 24.8 with a BUN and creatinine of 13 and 0.8, respectively.  She  was felt to be stable for discharge on October 03, 2001.  DISCHARGE MEDICATIONS: 1. Tylox 1-2 tablets q.4-6h. as needed for pain. 2. Prilosec 20 mg one daily. 3. Synthroid 150 mcg daily. 4. Lipitor 20 mg one daily. 5. Aspirin 325 mg one daily. 6. Avapro 300 mg daily. 7. Humulin L insulin 15 units twice daily. 8. Celexa 40 mg one daily. 9. Clonazepam 1 mg 4 tablets a day.  DISCHARGE ACTIVITIES:  The patient was told to avoid driving, strenuous activity, lifting heavy objects.  She was to walk daily and continue her breathing exercises.  DISCHARGE DIET:  1800-calorie ADA diet.  WOUND CARE:  The patient was told she could shower and clean her incision with soap and water.  DISPOSITION:  Home.  FOLLOWUP:  The patient was told to call her cardiologist at [facility] for a 2-week appointment.  She was told to see Dr. Laneta Simmers, Tuesday, June 24, at 9:00 a.m.  She was told to bring her chest x-ray to this appointment with her. Dictated by:   Adair Patter, P.A. Attending Physician:  Cleatrice Burke DD:  10/02/01 TD:  10/02/01 Job: [id number]

## 2010-10-01 ENCOUNTER — Emergency Department (HOSPITAL_COMMUNITY)
Admission: EM | Admit: 2010-10-01 | Discharge: 2010-10-01 | Disposition: A | Discharge status: Home / Self Care | Payer: Medicare Other | Attending: Emergency Medicine | Admitting: Emergency Medicine

## 2010-10-01 ENCOUNTER — Encounter (HOSPITAL_COMMUNITY): Payer: Self-pay

## 2010-10-01 ENCOUNTER — Emergency Department (HOSPITAL_COMMUNITY): Payer: Medicare Other

## 2010-10-01 DIAGNOSIS — S0003XA Contusion of scalp, initial encounter: Secondary | ICD-10-CM | POA: Insufficient documentation

## 2010-10-01 DIAGNOSIS — Z950 Presence of cardiac pacemaker: Secondary | ICD-10-CM | POA: Insufficient documentation

## 2010-10-01 DIAGNOSIS — Z794 Long term (current) use of insulin: Secondary | ICD-10-CM | POA: Insufficient documentation

## 2010-10-01 DIAGNOSIS — W1809XA Striking against other object with subsequent fall, initial encounter: Secondary | ICD-10-CM | POA: Insufficient documentation

## 2010-10-01 DIAGNOSIS — E1169 Type 2 diabetes mellitus with other specified complication: Secondary | ICD-10-CM | POA: Insufficient documentation

## 2010-10-01 DIAGNOSIS — Y92009 Unspecified place in unspecified non-institutional (private) residence as the place of occurrence of the external cause: Secondary | ICD-10-CM | POA: Insufficient documentation

## 2010-10-01 LAB — URINALYSIS, ROUTINE W REFLEX MICROSCOPIC
Bilirubin Urine: NEGATIVE
Ketones, ur: NEGATIVE mg/dL
Leukocytes, UA: NEGATIVE
Nitrite: NEGATIVE
Specific Gravity, Urine: 1.014 (ref 1.005–1.030)
Urobilinogen, UA: 0.2 mg/dL (ref 0.0–1.0)
pH: 6 (ref 5.0–8.0)

## 2010-10-01 LAB — POCT I-STAT, CHEM 8
BUN: 24 mg/dL — ABNORMAL HIGH (ref 6–23)
Chloride: 103 mEq/L (ref 96–112)
Creatinine, Ser: 1.1 mg/dL (ref 0.4–1.2)
Potassium: 3.2 mEq/L — ABNORMAL LOW (ref 3.5–5.1)
Sodium: 143 mEq/L (ref 135–145)

## 2010-10-01 LAB — GLUCOSE, CAPILLARY: Glucose-Capillary: 30 mg/dL — CL (ref 70–99)

## 2010-10-01 LAB — DIFFERENTIAL
Basophils Absolute: 0 10*3/uL (ref 0.0–0.1)
Lymphocytes Relative: 19 % (ref 12–46)
Lymphs Abs: 2.4 10*3/uL (ref 0.7–4.0)
Neutro Abs: 8.9 10*3/uL — ABNORMAL HIGH (ref 1.7–7.7)
Neutrophils Relative %: 69 % (ref 43–77)

## 2010-10-01 LAB — CBC
HCT: 36.8 % (ref 36.0–46.0)
MCV: 98.9 fL (ref 78.0–100.0)
RBC: 3.72 MIL/uL — ABNORMAL LOW (ref 3.87–5.11)
WBC: 13 10*3/uL — ABNORMAL HIGH (ref 4.0–10.5)

## 2010-10-01 LAB — URINE MICROSCOPIC-ADD ON

## 2010-10-05 ENCOUNTER — Ambulatory Visit (HOSPITAL_BASED_OUTPATIENT_CLINIC_OR_DEPARTMENT_OTHER): Payer: Medicare FFS

## 2010-10-05 ENCOUNTER — Other Ambulatory Visit: Payer: Self-pay | Admitting: Pulmonary Disease

## 2010-10-05 ENCOUNTER — Emergency Department (HOSPITAL_COMMUNITY)
Admission: EM | Admit: 2010-10-05 | Discharge: 2010-10-05 | Discharge status: Against Medical Advice | Payer: Medicare FFS | Attending: Emergency Medicine | Admitting: Emergency Medicine

## 2010-10-05 DIAGNOSIS — Z9989 Dependence on other enabling machines and devices: Secondary | ICD-10-CM

## 2010-10-14 ENCOUNTER — Emergency Department (HOSPITAL_COMMUNITY)
Admission: EM | Admit: 2010-10-14 | Discharge: 2010-10-14 | Disposition: A | Discharge status: Home / Self Care | Payer: Medicare Other | Attending: Emergency Medicine | Admitting: Emergency Medicine

## 2010-10-14 DIAGNOSIS — Z950 Presence of cardiac pacemaker: Secondary | ICD-10-CM | POA: Insufficient documentation

## 2010-10-14 DIAGNOSIS — Z794 Long term (current) use of insulin: Secondary | ICD-10-CM | POA: Insufficient documentation

## 2010-10-14 DIAGNOSIS — Y9210 Unspecified residential institution as the place of occurrence of the external cause: Secondary | ICD-10-CM | POA: Insufficient documentation

## 2010-10-14 DIAGNOSIS — T07XXXA Unspecified multiple injuries, initial encounter: Secondary | ICD-10-CM | POA: Insufficient documentation

## 2010-10-14 DIAGNOSIS — F411 Generalized anxiety disorder: Secondary | ICD-10-CM | POA: Insufficient documentation

## 2010-10-14 DIAGNOSIS — I10 Essential (primary) hypertension: Secondary | ICD-10-CM | POA: Insufficient documentation

## 2010-10-14 DIAGNOSIS — E119 Type 2 diabetes mellitus without complications: Secondary | ICD-10-CM | POA: Insufficient documentation

## 2010-10-14 DIAGNOSIS — W19XXXS Unspecified fall, sequela: Secondary | ICD-10-CM | POA: Insufficient documentation

## 2010-10-22 ENCOUNTER — Emergency Department (HOSPITAL_COMMUNITY): Payer: Medicare Other

## 2010-10-22 ENCOUNTER — Inpatient Hospital Stay (HOSPITAL_COMMUNITY)
Admission: EM | Admit: 2010-10-22 | Discharge: 2010-10-27 | DRG: 639 | Disposition: A | Discharge status: Skilled Nursing Facility | Payer: Medicare Other | Attending: Internal Medicine | Admitting: Internal Medicine

## 2010-10-22 DIAGNOSIS — W010XXA Fall on same level from slipping, tripping and stumbling without subsequent striking against object, initial encounter: Secondary | ICD-10-CM | POA: Diagnosis present

## 2010-10-22 DIAGNOSIS — E1169 Type 2 diabetes mellitus with other specified complication: Principal | ICD-10-CM | POA: Diagnosis present

## 2010-10-22 DIAGNOSIS — S0003XA Contusion of scalp, initial encounter: Secondary | ICD-10-CM | POA: Diagnosis present

## 2010-10-22 DIAGNOSIS — I2510 Atherosclerotic heart disease of native coronary artery without angina pectoris: Secondary | ICD-10-CM | POA: Diagnosis present

## 2010-10-22 DIAGNOSIS — Y9210 Unspecified residential institution as the place of occurrence of the external cause: Secondary | ICD-10-CM | POA: Diagnosis present

## 2010-10-22 DIAGNOSIS — I10 Essential (primary) hypertension: Secondary | ICD-10-CM | POA: Diagnosis present

## 2010-10-22 DIAGNOSIS — Z9181 History of falling: Secondary | ICD-10-CM

## 2010-10-22 DIAGNOSIS — E669 Obesity, unspecified: Secondary | ICD-10-CM | POA: Diagnosis present

## 2010-10-22 DIAGNOSIS — E785 Hyperlipidemia, unspecified: Secondary | ICD-10-CM | POA: Diagnosis present

## 2010-10-22 DIAGNOSIS — E039 Hypothyroidism, unspecified: Secondary | ICD-10-CM | POA: Diagnosis present

## 2010-10-22 DIAGNOSIS — Z951 Presence of aortocoronary bypass graft: Secondary | ICD-10-CM

## 2010-10-22 DIAGNOSIS — S0083XA Contusion of other part of head, initial encounter: Secondary | ICD-10-CM | POA: Diagnosis present

## 2010-10-22 DIAGNOSIS — Z950 Presence of cardiac pacemaker: Secondary | ICD-10-CM

## 2010-10-22 LAB — CBC
HCT: 36.8 % (ref 36.0–46.0)
Hemoglobin: 11.8 g/dL — ABNORMAL LOW (ref 12.0–15.0)
MCH: 31.6 pg (ref 26.0–34.0)
MCHC: 32.1 g/dL (ref 30.0–36.0)
MCV: 98.7 fL (ref 78.0–100.0)
RBC: 3.73 MIL/uL — ABNORMAL LOW (ref 3.87–5.11)

## 2010-10-22 LAB — URINALYSIS, ROUTINE W REFLEX MICROSCOPIC
Glucose, UA: 100 mg/dL — AB
Ketones, ur: NEGATIVE mg/dL
Leukocytes, UA: NEGATIVE
Nitrite: NEGATIVE
Specific Gravity, Urine: 1.02 (ref 1.005–1.030)
pH: 6 (ref 5.0–8.0)

## 2010-10-22 LAB — TROPONIN I: Troponin I: <0.3 ng/mL (ref <0.30)

## 2010-10-22 LAB — BASIC METABOLIC PANEL
BUN: 25 mg/dL — ABNORMAL HIGH (ref 6–23)
Creatinine, Ser: 0.74 mg/dL (ref 0.50–1.10)
GFR calc Af Amer: >60 mL/min (ref >60)
GFR calc non Af Amer: >60 mL/min (ref >60)
Glucose, Bld: 29 mg/dL — CL (ref 70–99)
Potassium: 3.6 mEq/L (ref 3.5–5.1)

## 2010-10-22 LAB — DIFFERENTIAL
Basophils Relative: 0 % (ref 0–1)
Lymphs Abs: 3.5 10*3/uL (ref 0.7–4.0)
Monocytes Absolute: 1.9 10*3/uL — ABNORMAL HIGH (ref 0.1–1.0)
Monocytes Relative: 12 % (ref 3–12)
Neutro Abs: 10.5 10*3/uL — ABNORMAL HIGH (ref 1.7–7.7)
Neutrophils Relative %: 65 % (ref 43–77)

## 2010-10-22 LAB — URINE MICROSCOPIC-ADD ON

## 2010-10-22 LAB — GLUCOSE, CAPILLARY: Glucose-Capillary: 139 mg/dL — ABNORMAL HIGH (ref 70–99)

## 2010-10-23 ENCOUNTER — Ambulatory Visit: Payer: Medicare FFS | Admitting: Pulmonary Disease

## 2010-10-23 LAB — CARDIAC PANEL(CRET KIN+CKTOT+MB+TROPI)
CK, MB: 3.9 ng/mL (ref 0.3–4.0)
CK, MB: 3.9 ng/mL (ref 0.3–4.0)
Relative Index: INVALID (ref 0.0–2.5)
Total CK: 74 U/L (ref 7–177)
Total CK: 71 U/L (ref 7–177)
Troponin I: <0.3 ng/mL (ref <0.30)

## 2010-10-23 LAB — COMPREHENSIVE METABOLIC PANEL
BUN: 23 mg/dL (ref 6–23)
CO2: 29 mEq/L (ref 19–32)
Calcium: 9.1 mg/dL (ref 8.4–10.5)
Chloride: 99 mEq/L (ref 96–112)
Creatinine, Ser: 0.66 mg/dL (ref 0.50–1.10)
GFR calc Af Amer: >60 mL/min (ref >60)
GFR calc non Af Amer: >60 mL/min (ref >60)
Total Bilirubin: 0.5 mg/dL (ref 0.3–1.2)

## 2010-10-23 LAB — DIFFERENTIAL
Eosinophils Relative: 1 % (ref 0–5)
Lymphocytes Relative: 18 % (ref 12–46)
Lymphs Abs: 2.4 10*3/uL (ref 0.7–4.0)
Monocytes Relative: 7 % (ref 3–12)
Neutrophils Relative %: 74 % (ref 43–77)

## 2010-10-23 LAB — URINE CULTURE: Culture: NO GROWTH

## 2010-10-23 LAB — CBC
HCT: 34.8 % — ABNORMAL LOW (ref 36.0–46.0)
MCH: 31.5 pg (ref 26.0–34.0)
MCV: 98 fL (ref 78.0–100.0)
RBC: 3.55 MIL/uL — ABNORMAL LOW (ref 3.87–5.11)
WBC: 13.1 10*3/uL — ABNORMAL HIGH (ref 4.0–10.5)

## 2010-10-23 LAB — GLUCOSE, CAPILLARY
Glucose-Capillary: 101 mg/dL — ABNORMAL HIGH (ref 70–99)
Glucose-Capillary: 263 mg/dL — ABNORMAL HIGH (ref 70–99)

## 2010-10-23 LAB — HEMOGLOBIN A1C: Hgb A1c MFr Bld: 6.8 % — ABNORMAL HIGH (ref <5.7)

## 2010-10-23 LAB — BILIRUBIN, DIRECT: Bilirubin, Direct: 0.1 mg/dL (ref 0.0–0.3)

## 2010-10-23 NOTE — H&P (Signed)
Jasmine Berger, Jasmine Berger              ACCOUNT NO.:  [id number]  MEDICAL RECORD NO.:  [id number]  LOCATION:  [facility]LED                         FACILITY:  [facility]  PHYSICIAN:  Talmage Nap, MD  DATE OF BIRTH:  August 22, 1924  DATE OF ADMISSION:  10/22/2010 DATE OF DISCHARGE:                             HISTORY & PHYSICAL   PRIMARY CARE PHYSICIAN:  None.  History obtainable from the patient.  CHIEF COMPLAINT:  Frequent falling and blacking out today.  HISTORY OF PRESENT ILLNESS:  The patient is an 77 year old Caucasian female who is a resident of assisted living facility with a history of coronary artery disease status post CABG and heart block status post pacemaker, was brought from the assisted living facility because the patient was said to have been having repeated episodes of falls.  There were no premonitory symptoms prior to the onset of fall.  The patient denied any history of chest pain prior to the onset of fall.  She denied any diaphoresis.  No nausea, no vomiting.  No fever.  No chills.  No rigor.  Today, however, the patient was said to have had a third episode of fall at the assisted living facility and immediate serum blood sugar level was said to be very low.  Emergency medical service was called and subsequently the patient was brought to the emergency room to be evaluated.  PAST MEDICAL HISTORY: 1. Positive for coronary artery disease. 2. Diabetes mellitus. 3. Hypertension 4. Heart block. 5. Obesity. 6. Congestive heart failure. 7. Hyperlipidemia. 8. Hypothyroidism. 9. Chronic anxiety disorder. 10.Allergies.  PAST SURGICAL HISTORY: 1. Coronary artery disease status post CABG. 2. Bifascicular heart block, status post pacemaker. 3. Hysterectomy. 4. Appendectomy. 5. Cholecystectomy.  MEDICATIONS:  Preadmission meds include: 1. Novolin R insulin regular 6 units if CBG is greater than 300. 2. Novolin N insulin NPH 5 units subcu q.a.m. 3. Tylenol (acetaminophen)  325 mg 2 tablets p.o. q.4 p.r.n. 4. Trazodone  100 mg 1 p.o. q.h.s. 5. Zocor (simvastatin) 20 mg 1 p.o. q.h.s. 6. Lisinopril 20 mg 1 p.o. q.a.m. 7. Symbicort (budesonide/formoterol) 80/4.5 inhale 1 puff b.i.d. 8. Lotrisone (clotrimazole/betamethasone) cream topically apply b.i.d. 9. Vitamin D3 1000 units 1 tablet p.o. q.a.m. 10.Vitamin B12 (cyanocobalamin) 500 mcg 1 p.o. q.a.m. 11.MiraLax (polyethylene glycol 3350) 17 g daily. 12.Meloxicam 7.5 mg 1 p.o. daily. 13.Promethazine 10 mg 1 p.o. daily. 14.Synthroid (levothyroxine) 175 mcg 1 p.o. daily. 15.Potassium chloride 20 mEq 1 p.o. q.a.m. 16.Imdur (isosorbide mononitrate) 60 mg 1 p.o. q.a.m. 17.Hydrochlorothiazide 25 mg 1 p.o. q.a.m. 18.Klonopin (clonazepam) 0.5 mg 1 p.o. q.a.m. 19.Citalopram 20 mg 1 p.o. q.a.m. 20.Aspirin enteric coated 81 mg 1 p.o. q.a.m. 21.Protonix (pantoprazole) 40 mg 1 p.o. q.a.m. 22.Duragesic (fentanyl patch) 12.5 mcg per hour transdermal 1 patch     q.72 h.  ALLERGIES:  PENICILLIN, COGENTIN, and SULFA.  SOCIAL HISTORY:  Negative for alcohol, tobacco use.  The patient is a resident of the assisted living facility.  FAMILY HISTORY:  Coronary artery disease is said to be positive in the family.  REVIEW OF SYMPTOMS:  The patient denies any history of headaches or blurred vision.  No nausea or vomiting.  No fever, no chills, no rigor. No chest  pain or shortness of breath.  No cough.  No abdominal discomfort.  No diarrhea or hematochezia.  No dysuria or hematuria.  Has swelling of the lower extremities.  No intolerance to heat or cold.  No neuropsychiatric disorder.  PHYSICAL EXAMINATION:  GENERAL:  The patient is very pleasant elderly lady with suboptimal hydration, not in any respiratory distress.  Has slight scalp hematoma at present. VITAL SIGNS:  Blood pressure is 202/94, pulse is 94, respiratory rate 20, temperature is 97.4. HEENT:  Pupils are reactive to light and extraocular muscles are  intact. NECK:  No jugular venous distention.  No carotid bruit.  No lymphadenopathy. CHEST:  Clear to auscultation. HEART:  Heart sounds are S1 and S2. ABDOMEN:  Soft, obese, nontender.  Liver, spleen, kidneys not palpable. Bowel sounds are positive. EXTREMITIES:  Showed +2 pedal edema. NEUROLOGIC EXAM:  Nonfocal. MUSCULOSKELETAL SYSTEM:  Showed arthritic changes in the knees and the feet. NEUROPSYCHIATRIC:  Evaluation is unremarkable. SKIN:  Showed decreased turgor.  LABORATORY DATA:  Initial complete blood count with differential showed WBC of 16.2, hemoglobin 11.8, hematocrit 36.8, MCV of 98.7, platelet count of 215, neutrophils 65%, absolute neutrophil count is 10.5.  First set of cardiac marker, troponin-I less than 0.30.  Urinalysis unremarkable.  Urine microscopy unremarkable.  Basic metabolic panel showed sodium of 140, potassium of 3.6, chloride of 100, bicarb of 29, glucose is 29, BUN is 25, creatinine 0.74.  Lactic acid level is 1.4.  Imaging studies done include chest x-ray, which shows stable cardiomegaly.  CT of the head without contrast showed no acute intracranial abnormality.  There is slight posterior scalp hematoma. EKG showed electronically paced rhythm with left-sided deviation.  IMPRESSION: 1. Frequent falls, etiology unknown, questionable secondary to     hypoglycemia?Somogyi effect. 2. Hypoglycemia. 3. Leukocytosis, source is unknown 4. Hypertension (uncontrolled). 5. History of congestive heart failure 6. Coronary artery disease status post coronary artery bypass     grafting. 7. History of heart block, status post pacemaker. 8. Hypothyroidism. 9. Diabetes mellitus. 10.Anxiety disorder. 11.Allergies. 12.Scalp hematoma.  PLAN:  Is to admit the patient to telemetry.  The patient will be slowly hydrated with half normal saline IV to go at rate of 65 cc an hour. Blood pressure will be controlled with lisinopril 20 mg p.o. daily, start on BiDil one  p.o. t.i.d.  She will be slowly diuresed with Lasix 40 mg IV q.24.  The patient will also be given KCl 10 mEq p.o. b.i.d. and Zocor 20 mg p.o. daily.  For hypothyroidism, she will be restarted on Synthroid 175 mcg p.o. daily.  The patient's insulin regimen will be on hold.  She will be placed on Accu-Cheks t.i.d. with a.c. h.s. regular insulin sliding scale (moderate scale).  She will empirically be treated for leukocytosis with Rocephin 1 g IV q.24 and GI prophylaxis will be with Protonix 40 mg p.o. daily.  Because of scalp hematoma, DVT prophylaxis will be done with TED stockings.  Further workup to be done on this patient will include cardiac enzymes q.6 x3, urine culture, blood culture x2 before starting IV antibiotics, serum C-peptide level, serum proinsulin level, hemoglobin A1c.  CBC, CMP, and magnesium will be repeated in a.m.  Physical Therapy will be consulted for gradual ambulation of this patient.  The patient will be followed and evaluated on day-to-day basis.     Talmage Nap, MD     CN/MEDQ  D:  10/23/2010  T:  10/23/2010  Job:  [id number]  Electronically Signed  by Talmage Nap  on 10/23/2010 06:47:41 AM

## 2010-10-24 LAB — BASIC METABOLIC PANEL
Chloride: 95 mEq/L — ABNORMAL LOW (ref 96–112)
GFR calc Af Amer: >60 mL/min (ref >60)
GFR calc non Af Amer: >60 mL/min (ref >60)
Potassium: 3.9 mEq/L (ref 3.5–5.1)
Sodium: 133 mEq/L — ABNORMAL LOW (ref 135–145)

## 2010-10-24 LAB — TSH: TSH: 1.45 u[IU]/mL (ref 0.350–4.500)

## 2010-10-24 LAB — GLUCOSE, CAPILLARY
Glucose-Capillary: 203 mg/dL — ABNORMAL HIGH (ref 70–99)
Glucose-Capillary: 238 mg/dL — ABNORMAL HIGH (ref 70–99)

## 2010-10-24 LAB — VITAMIN B12: Vitamin B-12: 495 pg/mL (ref 211–911)

## 2010-10-24 LAB — CBC
Hemoglobin: 11.2 g/dL — ABNORMAL LOW (ref 12.0–15.0)
MCHC: 32 g/dL (ref 30.0–36.0)
RDW: 13.5 % (ref 11.5–15.5)
WBC: 12.8 10*3/uL — ABNORMAL HIGH (ref 4.0–10.5)

## 2010-10-25 LAB — GLUCOSE, CAPILLARY
Glucose-Capillary: 269 mg/dL — ABNORMAL HIGH (ref 70–99)
Glucose-Capillary: 373 mg/dL — ABNORMAL HIGH (ref 70–99)
Glucose-Capillary: 400 mg/dL — ABNORMAL HIGH (ref 70–99)

## 2010-10-26 LAB — GLUCOSE, CAPILLARY
Comment 2: 0
Glucose-Capillary: 451 mg/dL — ABNORMAL HIGH (ref 70–99)
Glucose-Capillary: 220 mg/dL — ABNORMAL HIGH (ref 70–99)

## 2010-10-27 LAB — BASIC METABOLIC PANEL
BUN: 29 mg/dL — ABNORMAL HIGH (ref 6–23)
Chloride: 95 mEq/L — ABNORMAL LOW (ref 96–112)
Creatinine, Ser: 0.75 mg/dL (ref 0.50–1.10)
GFR calc Af Amer: >60 mL/min (ref >60)
Glucose, Bld: 346 mg/dL — ABNORMAL HIGH (ref 70–99)

## 2010-10-27 LAB — CBC
HCT: 40.2 % (ref 36.0–46.0)
MCV: 96.9 fL (ref 78.0–100.0)
RDW: 13 % (ref 11.5–15.5)
WBC: 12.9 10*3/uL — ABNORMAL HIGH (ref 4.0–10.5)

## 2010-10-27 LAB — GLUCOSE, CAPILLARY
Glucose-Capillary: 509 mg/dL — ABNORMAL HIGH (ref 70–99)
Glucose-Capillary: 391 mg/dL — ABNORMAL HIGH (ref 70–99)

## 2010-10-27 LAB — GLUCOSE, RANDOM: Glucose, Bld: 485 mg/dL — ABNORMAL HIGH (ref 70–99)

## 2010-10-29 LAB — PROINSULIN/INSULIN RATIO: Insulin: 16 u[IU]/mL (ref 3–19)

## 2010-10-29 LAB — CULTURE, BLOOD (ROUTINE X 2)
Culture  Setup Time: 201206220826
Culture: NO GROWTH

## 2010-11-06 NOTE — Discharge Summary (Signed)
Jasmine Berger, Jasmine Berger              ACCOUNT NO.:  [id number]  MEDICAL RECORD NO.:  [id number]  LOCATION:  1422                         FACILITY:  [facility]  PHYSICIAN:  Peggye Pitt, M.D. DATE OF BIRTH:  07-Jan-1925  DATE OF ADMISSION:  10/22/2010 DATE OF DISCHARGE:  10/27/2010                        DISCHARGE SUMMARY - REFERRING   PRIMARY CARE PHYSICIAN:  The patient's primary care physician is Dr. Florentina Jenny.  DISCHARGE DIAGNOSES: 1. Frequent falls related to hypoglycemic episodes. 2. Insulin-dependent diabetes mellitus with hypoglycemic episodes. 3. Coronary artery disease. 4. Hypertension. 5. History of heart block status post pacemaker. 6. Obesity. 7. Hyperlipidemia. 8. Hypothyroidism.  DISCHARGE MEDICATIONS:  Include: 1. Lantus insulin 15 units subcutaneously at bedtime. 2. Aspirin 81 mg daily. 3. Celexa 20 mg daily. 4. Hydrochlorothiazide 25 mg daily. 5. Imdur 60 mg 1 tablet daily. 6. Klonopin 0.5 mg daily.7. Potassium chloride 20 mEq daily. 8. Lisinopril 20 mg daily. 9. Loratadine 10 mg daily. 10.Clotrimazole cream one application twice daily to affected area. 11.Meloxicam 7.5 mg daily. 12.MiraLax 17 g daily. 13.Protonix 40 mg daily. 14.Symbicort 80/4.5 mcg one puff twice daily. 15.Synthroid 175 mg daily. 16.Trazodone 100 mg daily. 17.Tylenol 325 mg to take 2 tablets every 4 hours as needed for pain. 18.Vitamin B12 500 mcg 1 tablet daily. 19.Vitamin D3 1000 units 1 capsule daily. 20.Zocor 20 mg daily.  DISPOSITION AND FOLLOWUP:  Jasmine Berger will be discharged to [facility] today in stable and improved condition. Please note that her CBGs will need to be followed up on her and her dose adjusted as needed.  Please see below for details.  CONSULTATION THIS HOSPITALIZATION:  None.  IMAGES AND PROCEDURES:  Include: 1. A chest x-ray on October 22, 2010, that showed stable cardiac     enlargement and chronic lung changes with vascular  congestion. 2. CT scan of the head without contrast on October 22, 2010, that showed     no acute intracranial abnormalities and a decrease in the right     posterior scalp hematoma.  HISTORY AND PHYSICAL:  For full details, please see dictation on October 23, 2010, by Dr. Beverly Gust but in brief Jasmine Berger is a very pleasant 77 year old Caucasian lady with past medical history as above who presented to the hospital with complaints of frequent falls.  It appears that she has had frequent falls at her assisted living facility to the point where she has had significant bruises and scalp hematomas.  She was sent to emergency department today after another one of these falls and she was again noted to be hypoglycemic.  HOSPITAL COURSE BY PROBLEM: 1. Frequent falls.  At this point we believe this is secondary to     hypoglycemia.  We are arranging for her to go to short-term skilled     nursing facility for rehabilitation purposes as her assisted living     facility is unwilling to take her back at this time until her     sugars are better controlled. 2. Hypoglycemia.  She has fortunately not had any further hypoglycemic     episodes while in the hospital.  However, her CBGs have rapidly     been increasing, so we  have been increasing her Lantus and only     given her sliding scale insulin for CBG above 300. 3. Rest of her chronic conditions are stable.  Vitals on day of discharge, blood pressure 150/75, heart rate 71, respirations 20, sats 97% and temperature 98.4.     Peggye Pitt, M.D.     EH/MEDQ  D:  10/27/2010  T:  10/27/2010  Job:  [id number]  cc:   Florentina Jenny, MD Fax: [phone]  Electronically Signed by Peggye Pitt M.D. on 11/06/2010 08:16:13 AM

## 2010-11-20 ENCOUNTER — Ambulatory Visit: Payer: Medicare FFS | Admitting: Pulmonary Disease

## 2011-01-16 ENCOUNTER — Observation Stay: Payer: Self-pay | Admitting: Internal Medicine

## 2011-02-04 LAB — GLUCOSE, CAPILLARY
Glucose-Capillary: 105 mg/dL — ABNORMAL HIGH (ref 70–99)
Glucose-Capillary: 147 mg/dL — ABNORMAL HIGH (ref 70–99)
Glucose-Capillary: 180 mg/dL — ABNORMAL HIGH (ref 70–99)
Glucose-Capillary: 182 mg/dL — ABNORMAL HIGH (ref 70–99)
Glucose-Capillary: 187 mg/dL — ABNORMAL HIGH (ref 70–99)
Glucose-Capillary: 190 mg/dL — ABNORMAL HIGH (ref 70–99)
Glucose-Capillary: 198 mg/dL — ABNORMAL HIGH (ref 70–99)
Glucose-Capillary: 199 mg/dL — ABNORMAL HIGH (ref 70–99)
Glucose-Capillary: 229 mg/dL — ABNORMAL HIGH (ref 70–99)
Glucose-Capillary: 254 mg/dL — ABNORMAL HIGH (ref 70–99)
Glucose-Capillary: 280 mg/dL — ABNORMAL HIGH (ref 70–99)
Glucose-Capillary: 295 mg/dL — ABNORMAL HIGH (ref 70–99)
Glucose-Capillary: 303 mg/dL — ABNORMAL HIGH (ref 70–99)
Glucose-Capillary: 315 mg/dL — ABNORMAL HIGH (ref 70–99)
Glucose-Capillary: 317 mg/dL — ABNORMAL HIGH (ref 70–99)

## 2011-07-31 ENCOUNTER — Emergency Department: Payer: Self-pay | Admitting: Emergency Medicine

## 2011-08-02 ENCOUNTER — Inpatient Hospital Stay: Payer: Self-pay | Admitting: Internal Medicine

## 2011-08-02 ENCOUNTER — Ambulatory Visit: Payer: Self-pay | Admitting: Internal Medicine

## 2011-08-02 LAB — COMPREHENSIVE METABOLIC PANEL
Alkaline Phosphatase: 126 U/L (ref 50–136)
Anion Gap: 8 (ref 7–16)
BUN: 54 mg/dL — ABNORMAL HIGH (ref 7–18)
Bilirubin,Total: 0.6 mg/dL (ref 0.2–1.0)
Calcium, Total: 9 mg/dL (ref 8.5–10.1)
Chloride: 101 mmol/L (ref 98–107)
Co2: 31 mmol/L (ref 21–32)
Creatinine: 2.18 mg/dL — ABNORMAL HIGH (ref 0.60–1.30)
Glucose: 213 mg/dL — ABNORMAL HIGH (ref 65–99)
Osmolality: 301 (ref 275–301)
Potassium: 6.2 mmol/L — ABNORMAL HIGH (ref 3.5–5.1)
SGOT(AST): 116 U/L — ABNORMAL HIGH (ref 15–37)
SGPT (ALT): 66 U/L
Sodium: 140 mmol/L (ref 136–145)
Total Protein: 7.2 g/dL (ref 6.4–8.2)

## 2011-08-02 LAB — TSH: Thyroid Stimulating Horm: 1.57 u[IU]/mL

## 2011-08-02 LAB — URINALYSIS, COMPLETE
Hyaline Cast: 32
Leukocyte Esterase: NEGATIVE
Nitrite: NEGATIVE
Ph: 5 (ref 4.5–8.0)
Protein: NEGATIVE
RBC,UR: 3 /HPF (ref 0–5)
Squamous Epithelial: 5
WBC UR: 3 /HPF (ref 0–5)

## 2011-08-02 LAB — CBC
HGB: 12.9 g/dL (ref 12.0–16.0)
MCH: 33.4 pg (ref 26.0–34.0)
MCHC: 32.5 g/dL (ref 32.0–36.0)
MCV: 103 fL — ABNORMAL HIGH (ref 80–100)
Platelet: 198 10*3/uL (ref 150–440)
RBC: 3.85 10*6/uL (ref 3.80–5.20)
RDW: 13.7 % (ref 11.5–14.5)

## 2011-08-02 LAB — CK TOTAL AND CKMB (NOT AT ARMC): CK, Total: 180 U/L (ref 21–215)

## 2011-08-02 LAB — PROTIME-INR
INR: 1.1
Prothrombin Time: 14.1 secs (ref 11.5–14.7)

## 2011-08-02 LAB — LIPASE, BLOOD: Lipase: 110 U/L (ref 73–393)

## 2011-08-02 LAB — PRO B NATRIURETIC PEPTIDE: B-Type Natriuretic Peptide: 11881 pg/mL — ABNORMAL HIGH (ref 0–450)

## 2011-08-03 LAB — CBC WITH DIFFERENTIAL/PLATELET
Basophil #: 0 10*3/uL (ref 0.0–0.1)
Basophil %: 0 %
Eosinophil #: 0.1 10*3/uL (ref 0.0–0.7)
HCT: 32.8 % — ABNORMAL LOW (ref 35.0–47.0)
Lymphocyte #: 1.8 10*3/uL (ref 1.0–3.6)
Lymphocyte %: 11.1 %
MCHC: 33 g/dL (ref 32.0–36.0)
Monocyte #: 1.3 10*3/uL — ABNORMAL HIGH (ref 0.0–0.7)
Monocyte %: 8.4 %
Neutrophil #: 12.8 10*3/uL — ABNORMAL HIGH (ref 1.4–6.5)
Neutrophil %: 80.1 %
Platelet: 184 10*3/uL (ref 150–440)
WBC: 15.9 10*3/uL — ABNORMAL HIGH (ref 3.6–11.0)

## 2011-08-03 LAB — CK TOTAL AND CKMB (NOT AT ARMC): CK, Total: 161 U/L (ref 21–215)

## 2011-08-03 LAB — BASIC METABOLIC PANEL
BUN: 45 mg/dL — ABNORMAL HIGH (ref 7–18)
Calcium, Total: 8 mg/dL — ABNORMAL LOW (ref 8.5–10.1)
Co2: 30 mmol/L (ref 21–32)
EGFR (African American): 55 — ABNORMAL LOW
Osmolality: 292 (ref 275–301)
Potassium: 4.2 mmol/L (ref 3.5–5.1)
Sodium: 140 mmol/L (ref 136–145)

## 2011-08-03 LAB — APTT: Activated PTT: 85 secs — ABNORMAL HIGH (ref 23.6–35.9)

## 2011-08-04 LAB — APTT
Activated PTT: 117.6 secs — ABNORMAL HIGH (ref 23.6–35.9)
Activated PTT: 44.1 secs — ABNORMAL HIGH (ref 23.6–35.9)

## 2011-08-05 LAB — BASIC METABOLIC PANEL
Chloride: 105 mmol/L (ref 98–107)
EGFR (Non-African Amer.): >60
Osmolality: 283 (ref 275–301)
Potassium: 2.7 mmol/L — ABNORMAL LOW (ref 3.5–5.1)
Sodium: 144 mmol/L (ref 136–145)

## 2011-08-05 LAB — CBC WITH DIFFERENTIAL/PLATELET
Basophil %: 0.1 %
Eosinophil %: 0.5 %
Lymphocyte #: 2.1 10*3/uL (ref 1.0–3.6)
Lymphocyte %: 12.9 %
MCV: 100 fL (ref 80–100)
Monocyte #: 1 10*3/uL — ABNORMAL HIGH (ref 0.0–0.7)
Neutrophil #: 12.8 10*3/uL — ABNORMAL HIGH (ref 1.4–6.5)
Neutrophil %: 80.4 %
RDW: 13.3 % (ref 11.5–14.5)
WBC: 15.9 10*3/uL — ABNORMAL HIGH (ref 3.6–11.0)

## 2011-08-06 LAB — BASIC METABOLIC PANEL
BUN: 8 mg/dL (ref 7–18)
Calcium, Total: 8.9 mg/dL (ref 8.5–10.1)
Chloride: 105 mmol/L (ref 98–107)
Co2: 31 mmol/L (ref 21–32)
EGFR (African American): >60
EGFR (Non-African Amer.): >60
Glucose: 109 mg/dL — ABNORMAL HIGH (ref 65–99)
Potassium: 2.4 mmol/L — CL (ref 3.5–5.1)
Sodium: 144 mmol/L (ref 136–145)

## 2011-08-07 LAB — BASIC METABOLIC PANEL
Anion Gap: 9 (ref 7–16)
Anion Gap: 6 — ABNORMAL LOW (ref 7–16)
BUN: 6 mg/dL — ABNORMAL LOW (ref 7–18)
Calcium, Total: 8.7 mg/dL (ref 8.5–10.1)
Co2: 32 mmol/L (ref 21–32)
Creatinine: 0.71 mg/dL (ref 0.60–1.30)
EGFR (African American): >60
EGFR (African American): >60
Glucose: 166 mg/dL — ABNORMAL HIGH (ref 65–99)
Glucose: 282 mg/dL — ABNORMAL HIGH (ref 65–99)
Osmolality: 293 (ref 275–301)
Potassium: 2.5 mmol/L — CL (ref 3.5–5.1)
Sodium: 146 mmol/L — ABNORMAL HIGH (ref 136–145)
Sodium: 138 mmol/L (ref 136–145)

## 2011-08-07 LAB — MAGNESIUM: Magnesium: 1.9 mg/dL

## 2011-08-08 LAB — BASIC METABOLIC PANEL
Anion Gap: 7 (ref 7–16)
BUN: 3 mg/dL — ABNORMAL LOW (ref 7–18)
Calcium, Total: 8.1 mg/dL — ABNORMAL LOW (ref 8.5–10.1)
Chloride: 98 mmol/L (ref 98–107)
Chloride: 99 mmol/L (ref 98–107)
Co2: 33 mmol/L — ABNORMAL HIGH (ref 21–32)
Co2: 32 mmol/L (ref 21–32)
Creatinine: 0.8 mg/dL (ref 0.60–1.30)
Creatinine: 0.78 mg/dL (ref 0.60–1.30)
EGFR (African American): >60
Glucose: 245 mg/dL — ABNORMAL HIGH (ref 65–99)
Glucose: 344 mg/dL — ABNORMAL HIGH (ref 65–99)
Osmolality: 282 (ref 275–301)
Osmolality: 286 (ref 275–301)
Potassium: 2.3 mmol/L — CL (ref 3.5–5.1)
Sodium: 139 mmol/L (ref 136–145)
Sodium: 138 mmol/L (ref 136–145)

## 2011-08-08 LAB — MAGNESIUM
Magnesium: 1.5 mg/dL — ABNORMAL LOW
Magnesium: 2 mg/dL

## 2011-08-08 LAB — CULTURE, BLOOD (SINGLE)

## 2011-08-10 LAB — BASIC METABOLIC PANEL
Anion Gap: 8 (ref 7–16)
BUN: 3 mg/dL — ABNORMAL LOW (ref 7–18)
Calcium, Total: 8.4 mg/dL — ABNORMAL LOW (ref 8.5–10.1)
Chloride: 97 mmol/L — ABNORMAL LOW (ref 98–107)
Creatinine: 0.67 mg/dL (ref 0.60–1.30)
EGFR (Non-African Amer.): >60
Glucose: 198 mg/dL — ABNORMAL HIGH (ref 65–99)
Sodium: 138 mmol/L (ref 136–145)

## 2011-09-01 ENCOUNTER — Ambulatory Visit: Payer: Self-pay | Admitting: Internal Medicine

## 2011-09-01 DEATH — deceased

## 2013-02-21 IMAGING — CR DG ABDOMEN 1V
1 series · 2 of 2 positions shown · non-contrast
Comparison: none

REASON FOR EXAM: colitis
COMMENTS:

[Series 1: supine kub · 0.17mm/px · 2 of 2 slices shown]
[im 1/2]
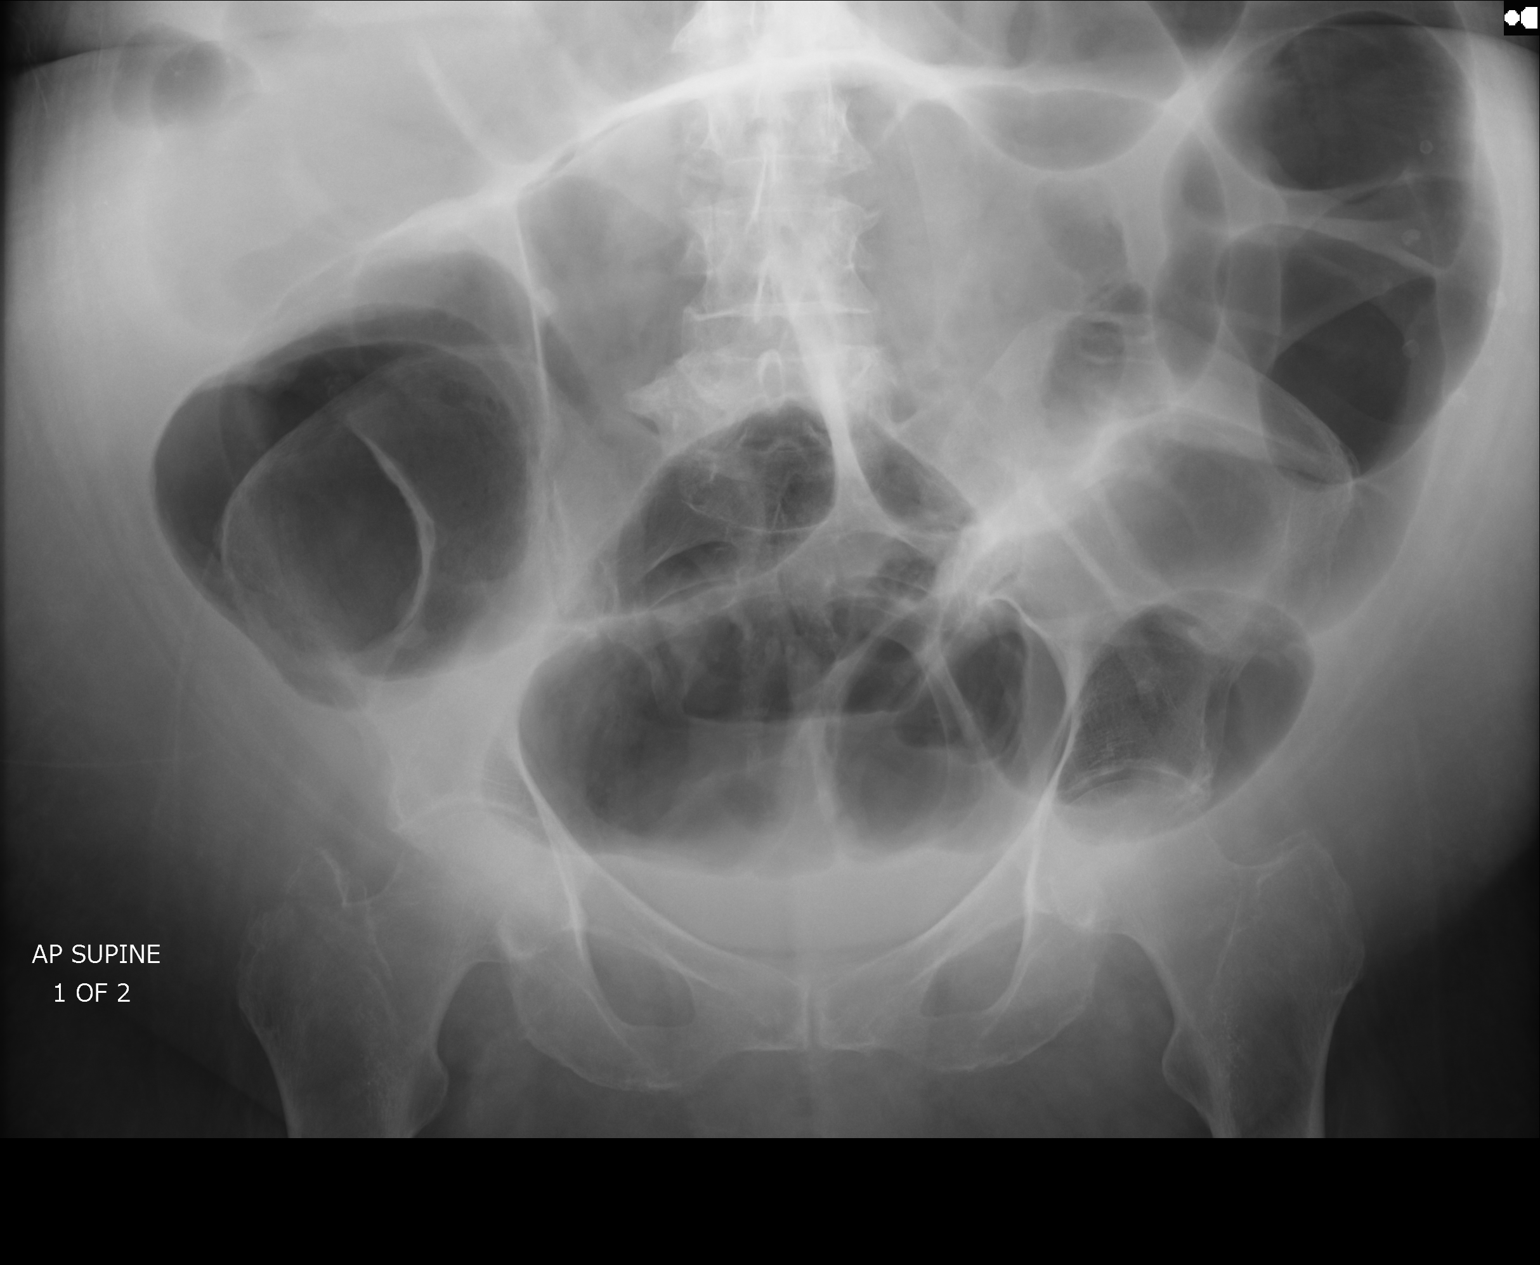
[im 2/2]
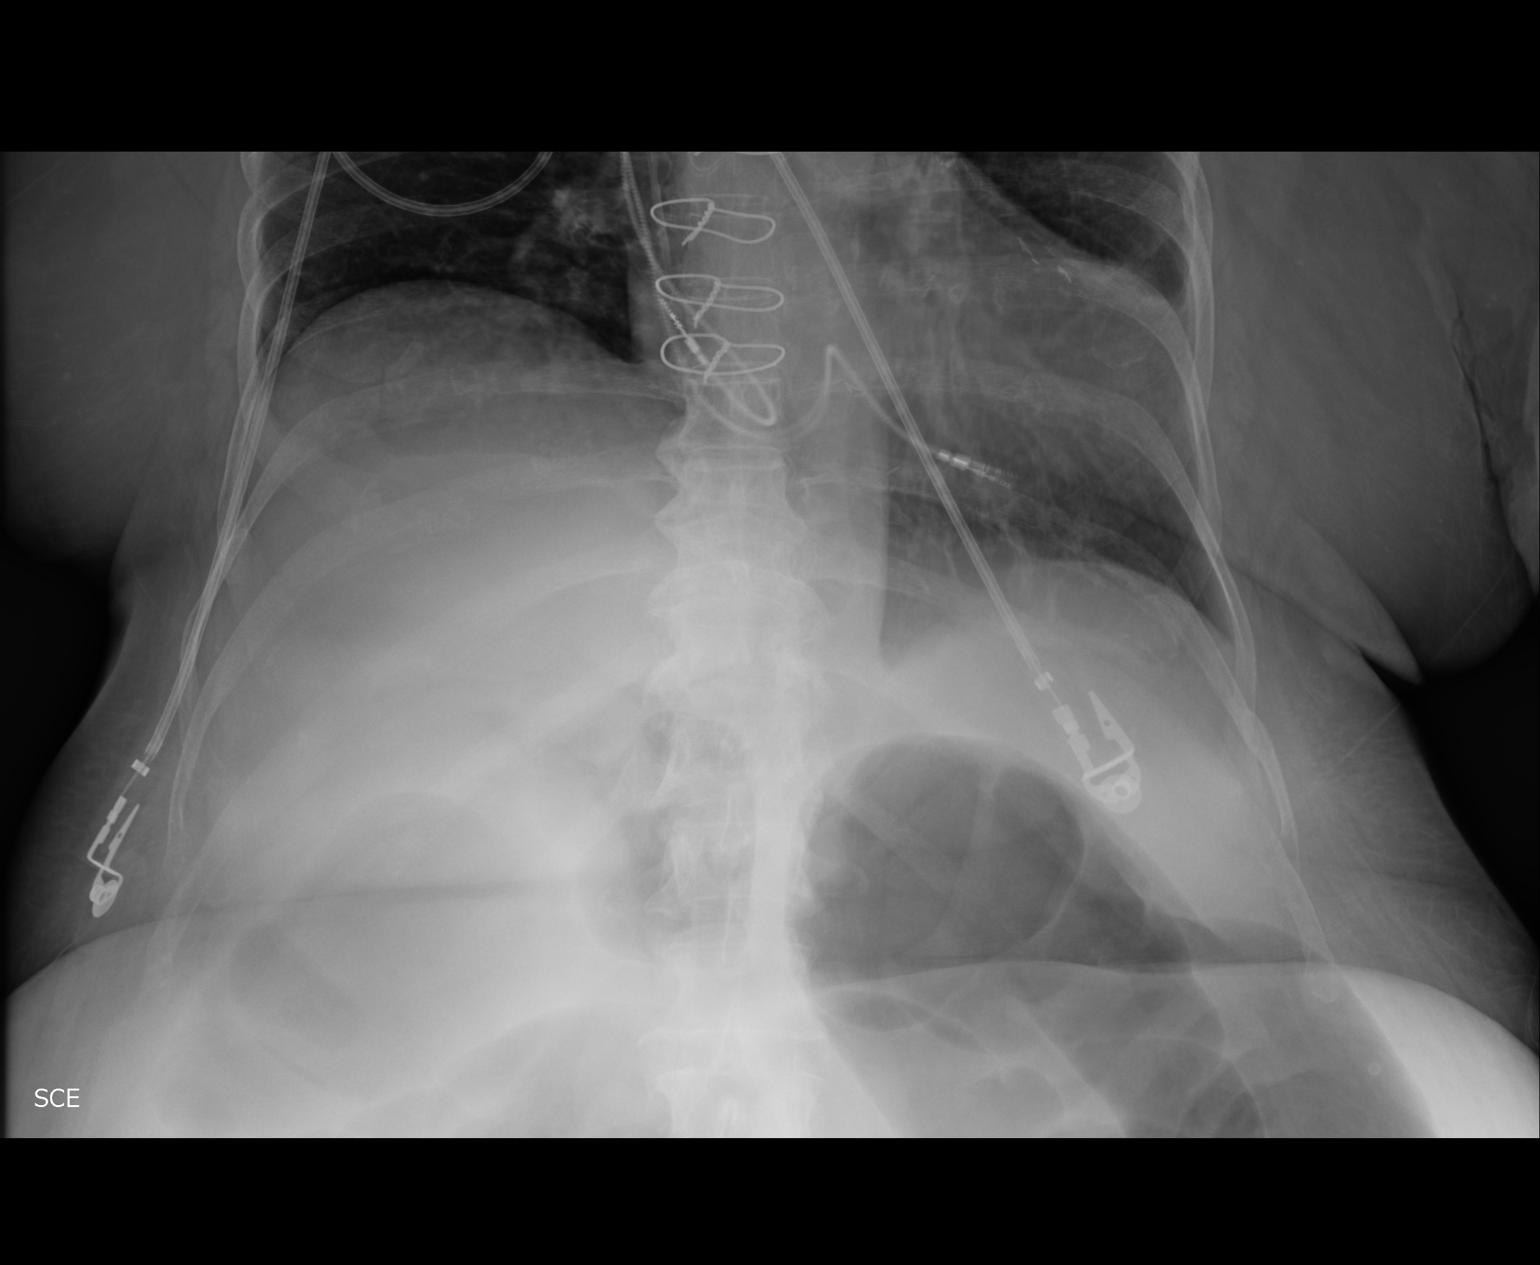

[2 of 2 positions shown; findings below may reference images not displayed]

PROCEDURE:     DXR - DXR KIDNEY URETER BLADDER  - August 09, 2011 [DATE]

RESULT:     There is gaseous distention of multiple bowel loops. The degree
of distention appears slightly greater than was present on the prior exam of
04/22/2010. No mass is identified on plane film examination. No abnormal
intraabdominal calcifications are noted.
IMPRESSION: 1.     There is gaseous distention of bowel loops, primarily the colon, but
some loops of small bowel may also be present. The degree of bowel
distention is greater than that noted on a prior exam of 04/22/2010.
Correlation with abdominal CT might be helpful.

## 2014-08-25 NOTE — H&P (Signed)
PATIENT NAME:  Jasmine Berger, Jasmine Berger MR#:  [id number] DATE OF BIRTH:  10/26/1924  DATE OF ADMISSION:  08/02/2011  PRIMARY CARE PHYSICIAN: Dr. Terance Hart    REASON FOR ADMISSION: The patient is an 81 year old Caucasian female with past medical history significant for history of hypertension, hyperlipidemia, hypothyroidism, and recurrent falls who presented to the hospital with recurrent nausea and vomiting. Apparently the patient was here in the Emergency Room over the weekend on Saturday after fall. She was having nausea and vomiting in the facility. She had x-rays of her right hip as well as CT of her hip and head as well as cervical spine. CT of the hip showed possible fracture through the base of greater trochanter and the patient was discharged to skilled nursing facility. She is coming back to the Emergency Room with recurrent nausea and vomiting today. On arrival to the Emergency Room, she was found to be dehydrated and hypotensive with systolic blood pressure only 99/50's. Her oxygen saturation was also noted to be very low in the 50's on room air. However, only on 3 liters of oxygen her oxygen saturation went up to 100's. She was complaining of possible left lower quadrant abdominal discomfort and she was noted to be having leukocytosis with white blood cell count of more than 24,000. Because of concerns about diverticulitis, the patient had a CT scan of her abdomen done here in the Emergency Room which showed fecal impaction as well as small amount of pneumatosis of rectum as well as distal sigmoid colon and possible thickening of the wall of the proximal small bowel but no perforation or abscess formation in either location was noted. Hospitalist services were contacted for admission. The patient herself is not able to provide much history, however, tells me that she has been having problems with some fatigue and weakness as well as shoulder and back pain. She denies any abdominal pains, however. Admits of  having some leg pain three days ago after fall, however, denies any significant leg pain now. In the Emergency Room, she was also noted to have elevated troponin as well as hyperkalemia and acute renal failure. Hospitalist services were contacted for admission for the same.   PAST MEDICAL HISTORY:  1. History of admission for chest pains in September 2012. At that time she had a stress test which was negative for evidence of ischemia. 2. Diabetes mellitus, type II. 3. Hypertension. 4. Anxiety. 5. Depression.  6. Hyperlipidemia.  7. Hypothyroidism.  8. Recurrent falls.  9. Fibrocystic breast disease.  10. AV nodal heart block status post pacemaker placement.  11. History of cerebrovascular accident. 12. History of sleep apnea, on CPAP at home.  13. History of coronary artery disease. 14. Stroke. 15. B12 deficiency. 16. Osteoarthritis. 17. Carpal tunnel syndrome.   PAST SURGICAL HISTORY:  1. Breast biopsy. 2. Cholecystectomy. 3. Appendectomy. 4. Temporary pacemaker placement. 5. Hysterectomy. 6. CABG x6. 7. Hemorrhoidectomy x3.   MEDICATIONS: According to medical records from the facility, the patient is on: 1. Acetaminophen 650 mg 4 times daily at 10 a.m., 2 p.m., 6 p.m., as well as 10 p.m., not more than 3000 of acetaminophen a day from all sources; also, 2 tablets of 650 mg every four hours as needed.  2. Aripiprazole 10 mg p.o. at bedtime.  3. Aspirin 81 mg p.o. daily.  4. Budesonide-formoterol 80/4.5 mg 1 puff at bedtime.  5. Cholecalciferol 1000 units once daily.  6. Clonazepam 0.5 mg twice daily and 0.5 mg every eight hours as needed  for agitation.  7. Cyanocobalamin 500 mcg p.o. daily.  8. Docusate-senna 50/8.6 mg 1 tablet twice daily.  9. Fentanyl 25 mcg transdermal film every 72 hours.  10. Hall's lozenges, sugar free, one lozenge every four hours as needed.  11. Hydrochlorothiazide 25 mg p.o. daily.  12. Isosorbide mononitrate 60 mg p.o. daily.  13. Lactulose 15 mL  twice daily.  14. Lantus 32 units in the morning and 16 units at bedtime.  15. Levothyroxine 150 mcg p.o. daily.  16. Lidocaine 5% topical film 1 to 2 patches once daily.  17. Linagliptin 5 mg p.o. daily.  18. Lisinopril 10 mg p.o. daily.  19. Loratadine 10 mg p.o. daily.  20. Meloxicam 7.5 mg p.o. daily.  21. Metformin 500 mg p.o. twice daily.  22. Novolin R sliding scale.  23. Pantoprazole 40 mg p.o. daily.  24. Potassium chloride 20 mEq p.o. daily.  25. Sertraline 100 mg p.o. daily.  26. Simvastatin 20 mg p.o. daily.  27. Trazodone 100 mg p.o. at bedtime.   ALLERGIES: Codeine, Inderal, Isoptin, Lotensin, penicillin, sulfa, as well as Verapamil.   FAMILY HISTORY: Diabetes in patient's family. Patient's brother died of unknown cancer. The patient's father had MI at age of 78 and died at age of 28.   SOCIAL HISTORY: The patient is widowed. Lives in skilled nursing facility since summer last year. Denies tobacco, alcohol, or drug abuse.   REVIEW OF SYSTEMS: Positive for fatigue and weakness, shoulder and back pains, and leg pain in the past as well as intermittent nausea and vomiting. Not able to provide much history because of some confusion. Denies, however, fevers, weight loss or gain. In regards to eyes, denies blurry vision, double vision, glaucoma, or cataracts. ENT: Denies any tinnitus, allergies, epistaxis, sinus pain, dentures, difficulty swallowing. RESPIRATORY: Denies any cough, wheezes, asthma, COPD. CARDIOVASCULAR: Denies chest pains, orthopnea, edema, arrhythmias, palpitations, or syncope. GASTROINTESTINAL: Denies nausea, vomiting, diarrhea, or constipation. GENITOURINARY: Denies dysuria, hematuria, frequency, or incontinence. ENDOCRINOLOGY: Denies any polydipsia, nocturia, thyroid problems, heat or cold intolerance, or thirst. HEMATOLOGIC: Denies any anemia, easy bruising, bleeding, swollen glands. SKIN: Denies any acne, rash, lesions, change in moles. MUSCULOSKELETAL: Denies  arthritis, cramps, swelling, gout. NEUROLOGIC: No numbness, epilepsy, tremor. PSYCHIATRIC: Denies anxiety, insomnia, or depression.      PHYSICAL EXAMINATION:   VITAL SIGNS: On arrival to the hospital, temperature 99.4, pulse 69, respiration rate 20, blood pressure 99/53, saturation 52% on room air and 95% on oxygen therapy.   GENERAL: This is a well developed, well nourished obese pale Caucasian female in no significant distress comfortable on the stretcher.   HEENT: Her pupils are equal and reactive to light. Extraocular movements intact. No icterus or conjunctivitis. Has mild difficulty hearing. No pharyngeal erythema. Mucosa is moist.   NECK: Neck did not reveal any masses. Supple, nontender. Thyroid not enlarged. No adenopathy. No JVD or carotid bruits bilaterally. Full range of motion.   LUNGS: Crackles at bases. Few rhonchi were heard. Somewhat diminished breath sounds but no wheezing, labored inspirations, increased effort. No dullness to percussion. Not in overt respiratory distress.   CARDIOVASCULAR: S1, S2 appreciated. No murmurs, gallops, or rubs noted. PMI not lateralized. Chest is nontender to palpation. 1+ pedal pulses. No lower extremity edema, calf tenderness, or cyanosis noted.   ABDOMEN: Soft, nontender. Bowel sounds are present. No hepatosplenomegaly or masses were noted.   RECTAL: Deferred.   MUSCULOSKELETAL: Muscle strength able to move all extremities. No cyanosis, degenerative joint disease, or kyphosis. Gait not tested.  SKIN: Skin did not reveal any rashes, lesions, erythema, nodularity, or induration. It was warm and dry to palpation.   LYMPH: No adenopathy in the cervical region.   NEUROLOGICAL: Cranial nerves grossly intact. The patient did not have dysarthria, aphasia. She is somnolent, briefly opens her eyes and then drifts back to sleep, cooperative, however, her memory is impaired. She is somewhat confused but no agitation was noted.   LABORATORY,  DIAGNOSTIC, AND RADIOLOGICAL DATA: BMP showed glucose 213, BUN and creatinine were 54 and 2.18, potassium 6.2. B-type Natriuretic Peptide 11,881. Lipase level is normal at 110. Liver enzymes showed AST elevation to 116, otherwise unremarkable liver enzymes. CK total 180. MB fraction 6.2. Troponin 1.64. TSH normal at 1.57. White blood cell count is significantly elevated to 25.4, hemoglobin 12.9, platelet count 198, MCV high at 103. Coagulation panel within normal limits. Urinalysis yellow hazy urine, negative for glucose or bilirubin, trace ketones, specific gravity 1.018, pH 5.0, negative for blood, protein, nitrites, or leukocyte esterase, 3 red blood cells as well as 3 white blood cells, 1+ bacteria, 5 epithelial cells were noted. Mucus was present as well as 32 hyaline casts.   ABGs were done on 44% FiO2 and showed pH of 7.22, pCO2 65, pO2 88, saturation 94.7% on nasal cannula. Lactic acid level is normal at 1.2.   EKG showed sequential or dual-chamber electronic pacemaker, nonspecific ST-T changes were noted.   Chest, portable, single view, 08/02/2011, showed no acute abnormality except of cardiomegaly, prior CABG. Cardiac pacer leads noted with tips in the right atrium as well as right ventricle, stable since September 2012.  Pelvis AP only x-ray 08/02/2011 showed no acute abnormality, degenerative changes of lumbar spine as well as both hips were noted.   CT scan of abdomen and pelvis without contrast revealed findings suggestive fecal impaction. Small amount of pneumatosis in the rectum and distal sigmoid colon, possible thickening of the wall of proximal small bowel. No definite perforation or abscess formation on either location noted. Atherosclerotic disease was present. Small amount of ascites was present. Trace pleural effusions were present with some compressive atelectasis in lower lobes.   ASSESSMENT AND PLAN:  1. Nausea and vomiting. Admit patient to medical floor. Continue symptomatic  therapy as well as IV fluids. Possibly related to some element of constipation and questionable gut inflammation, large bowel inflammation.  2. Elevated white blood cell count due infection in the gut as well as possibly bacterial pneumonia. Admit patient to medical floor. Start her on Levaquin as well as Flagyl IV. Unable to use Zosyn or Imipenem due to allergies to penicillin. 3. Fecal impaction. Will give patient enema. Will also continue her on stool softeners as well as stimulants.  4. Acute renal failure. Continue patient on high rate IV fluids.  5. Hyperkalemia. The patient already received insulin as well as D50 in the hospital. Will repeat the patient's potassium levels now.  6. Elevated transaminases of unclear etiology. Check acetaminophen level and get pro-time level. Follow the patient's transaminases in the morning. 7. Elevated troponin, likely hypoxia related.  8. Acute respiratory failure of unclear etiology at this time, questionable pneumonia versus obstructive sleep apnea versus COPD. Continue patient on Levaquin IV. Get sputum cultures if she produces.  9. Questionable urinary tract infection. Get urine cultures. Continue patient on Levaquin.  10. Mild pneumatosis. Get Gastroenterology as well as Surgery involved.   TIME SPENT: 1 hour, 15 minutes.   ____________________________ Katharina Caper, MD rv:drc D: 08/02/2011 18:16:33 ET T: 08/03/2011  06:00:31 ET JOB#: [id number]  cc: Katharina Caperima Haizlee Henton, MD, <Dictator> Teena Iraniavid M. Terance HartBronstein, MD Katharina CaperIMA Nadia Torr MD ELECTRONICALLY SIGNED 08/04/2011 14:30

## 2014-08-25 NOTE — Consult Note (Signed)
Pt with CT suggesting pneumatosis of rectum and sigmoid, CHF (BNP very elevated) with acute MI by troponins, previous CABG, Currently knows she is in hospital, knows name of 2 visitors to her room, denies abd pain or rectal pain, nurse noted stool this morning described as mushy brown without blood.  Exam of abd today shows umbilical hernia, no signif abd tenderness, rectal with no perianal disease, lite brown stool without blood.  Abnormal CT without clinical findings of significant GI tract disease.  No investigational plans, agree with stool softeners, laxatives, antibiotics.  Will follow with you.  Electronic Signatures: Scot JunElliott, Larsen Dungan T (MD)  (Signed on 02-Apr-13 14:43)  Authored  Last Updated: 02-Apr-13 14:43 by Scot JunElliott, Krysteena Stalker T (MD)

## 2014-08-25 NOTE — Consult Note (Signed)
Patient admitted with colitis, MI.  Has poor venous access requiring multiple IV medications.  Central line placed at bedside without difficulty  Electronic Signatures: Annice Needyew, Jason S (MD)  (Signed on 05-Apr-13 13:01)  Authored  Last Updated: 05-Apr-13 13:01 by Annice Needyew, Jason S (MD)

## 2014-08-25 NOTE — Consult Note (Signed)
Brief Consult Note: Diagnosis: Abdominal pain, fecal impaction, n/v-resolved, abnormal abd CT pneumatosis and possile thickening small bowel, MI, multiple medical problems.   Patient was seen by consultant.   Consult note dictated.   Comments: Medical management. Pt is not a luminal candidate at this time. Case d/w Dr. Mechele CollinElliott in collaboration of care.  Electronic Signatures: Rowan BlaseMills, Kalimah Capurro Ann (NP)  (Signed 02-Apr-13 14:14)  Authored: Brief Consult Note   Last Updated: 02-Apr-13 14:14 by Rowan BlaseMills, Crystel Demarco Ann (NP)

## 2014-08-25 NOTE — Consult Note (Signed)
PATIENT NAME:  Jasmine Berger, Jasmine Berger MR#:  [id number] DATE OF BIRTH:  [birth year]  DATE OF CONSULTATION:  08/03/2011  REFERRING PHYSICIAN:  Katharina Caper, MD  CONSULTING PHYSICIAN:  Lynnae Prude, MD/Cherae Marton A. Arvilla Market, ANP  PRIMARY CARE PROVIDER: Dr. Terance Hart    REASON FOR CONSULTATION: Gut pneumatosis.   HISTORY OF PRESENT ILLNESS: This is an 81 year old patient with history of hypertension, hypothyroid, hyperlipidemia, recurrent falls, and possible new hip fracture who was brought into the hospital yesterday for nausea, vomiting, and lethargy and was found in the Emergency Room to have hypotension, dehydration, leukocytosis, elevated troponin, hyperkalemia, and acute renal failure. CT scan showed fecal impaction as well as small amount of pneumatosis of the rectum as well as distal sigmoid colon and possible thickening of the wall of the proximal small bowel without perforation or abscess. The patient was hospitalized and GI is consulted regarding further evaluation and management.   The patient lives at [facility], has had progressive dementia. She had a fall on Saturday, went to the Emergency Room and had a diagnosis of possible slight hip fracture that does not require surgery. She was sent back home on bedrest. On Sunday afternoon family noticed her to be sitting in a chair, although she was back and forth with wheelchair, therefore, not ambulatory. She seemed to be tolerating diet well. Yesterday, however, they noticed some difficulty with appetite, possible slight temperature, and the patient says she did not feel right. She has had a history of chronic stable constipation and has been utilizing laxatives as needed. Sunday she complained of left rib pain, left anterior chest pain, became confused, more lethargic, and was brought to the hospital. The patient is currently reporting feeling comfortable. She has tolerated clear liquids all day long today with no further nausea or vomiting. For the  impaction, the patient has been given enema yesterday, laxatives today, and is passing her bowels. She has been evaluated by Dr. Michela Pitcher from Surgery. The patient says she feels comfortable now. Denies chest pain, shortness of breath, or abdominal pain.   Previous colonoscopy performed 11/08/2003 for rectal bleeding revealed nonbleeding external hemorrhoids. Exam was otherwise normal to the cecum. The patient was hospitalized December 2011 for fall at home, ankle fracture, and had a CT scan of the abdomen and pelvis showing significant dilatation of the sigmoid, descending, and part of the transverse colon filled with fluid. No definite distal rectal obstruction was seen. Dr. Niel Hummer saw her in consultation. A rectal tube was placed with relief. He diagnosed adynamic colon or adynamic ileus as she has almost constant hyperactive bowel sounds. She was having diarrhea with leukocytosis to rule out possibility of infectious or inflammatory etiology, stool studies negative for C. difficile. She is on empiric antibiotic therapy. Possibility of ischemic colitis was raised. The patient did well overnight and he recommended outpatient luminal evaluation which was not completed.   PAST MEDICAL HISTORY:  1. Diabetes mellitus, type II.  2. Hypertension.  3. Anxiety, depression. 4. Recurrent falls.  5. History of ankle fracture. 6. Possible CT showing right hip fracture through the base of the greater trochanter.  7. Chest pain admission September 2012; stress test negative for evidence of ischemia.  8. Fibrocystic breast disease.  9. AV nodal heart block with pacemaker.  10. Cerebrovascular accident.  11. Sleep apnea syndrome.  12. Coronary artery disease.  13. B12 deficiency.  14. Osteoarthritis.  15. Carpal tunnel syndrome.   PAST SURGICAL HISTORY:  1. Breast biopsy.  2. Cholecystectomy.  3.  Appendectomy.  4. Temporary and then permanent pacemaker placement.  5. Hysterectomy.  6. Coronary artery  bypass graft x6. 7. Hemorrhoidectomy x3.  8. Colonoscopy 2005.   MEDICATIONS ON ADMISSION ACCORDING TO FACILITY NOTES AND CHECKED WITH DAUGHTER: 1. Acetaminophen.  2. Aripiprazole 10 mg at bedtime.  3. Aspirin 81 mg daily.  4. Budesonide/formoterol 80/4.5 mg 1 puff at bedtime.  5. Calciferol 1000 units once daily. 6. Clonazepam 0.5 mg twice daily and 0.5 mg every eight hours as needed for agitation.  7. Cyanocobalamin 500 mcg 1 daily, Vitamin B.  8. Docusate-Senna 50/8.6 mg 1 tablet twice daily.  9. Fentanyl 25 mcg transdermal film every 72 hours. She has been on that chronically.  10. Hydrochlorothiazide 25 mg daily.  11. Isosorbide mononitrate 60 mg daily.  12. Lactulose 15 mL twice daily.  13. Lantus 32 units in the morning, 16 units at bedtime.  14. Levothyroxine 150 mcg daily.  15. Lidocaine 5% topical film 1 to 2 patches once daily.  16. Linagliptin 5 mg daily.  17. Lisinopril 10 mg orally daily.  18. Loratadine 10 mg daily.  19. Meloxicam 7.5 mg daily.  20. Metformin 500 mg twice daily.  21. Novolin R sliding scale.  22. Pantoprazole 40 mg daily.  23. Potassium chloride 20 mEq daily.  24. Sertraline 100 mg daily.  25. Simvastatin 20 mg daily.  26. Trazodone 100 mg at bedtime.   ALLERGIES: Codeine, Inderal, Isoptin, Lotensin, penicillin, sulfa, and verapamil.   HABITS: Negative tobacco or alcohol.   FAMILY HISTORY: Brother deceased with leg cancer. Father had MI at 67. Mother with bone cancer diagnosed at 93, deceased at 57, may have started as a primary sinus cancer with metastasis.   SOCIAL HISTORY: Widowed x1 year. Lives at [facility] for one year.  REVIEW OF SYSTEMS: Positive for generalized weakness, chronic arthritis pain on chronic pain medication, right hip feels crampy, a little achy. GI as noted. She's had progressive dementia according to the family. The patient is a vague historian. She did have chest pain yesterday, denies any now. Denies shortness of  breath. Remaining 10 systems negative.    PHYSICAL EXAMINATION:   VITAL SIGNS: Temperature 98.4, pulse 71, respiratory rate 18, blood pressure 140/79, pulse oximetry 95 to 99% on 2 liters nasal cannula.   GENERAL: Well developed, well nourished, obese Caucasian female resting supine in bed.   HEENT: Head is normocephalic. Conjunctivae pink. Sclerae anicteric. Oral mucosa is moist and intact.   NECK: Supple. No thyromegaly. Trachea midline.   LUNGS: Bibasilar crackles. Respirations are nonlabored, utilizing oxygen therapy.   CARDIOVASCULAR: S1, S2 with slight murmur noted, regular rhythm, no gallop.   ABDOMEN: Obese, soft. Bowel sounds are active. Slight umbilical hernia noted. Very slight tenderness left lower quadrant. Abdomen is protuberant. Family reports it is just larger than her normal baseline. Nondistended.   RECTAL: Rectal exam performed by Dr. Mechele Collin and deferred.   MUSCULOSKELETAL: Hand grasp was equal bilateral. The patient is unable to assist with movement to the side. Gait not evaluated.    SKIN: Skin with some bruising. There is some mild nonspecific edema in the upper arms. There is 1+ edema in the lower extremity.   NEUROLOGIC: Cranial nerves grossly intact. The patient knows her family, knows she's at [facility], cannot give any detailed history regarding how she came to the hospital. Speech is clear. She does stay awake during the conversation, answers questions when directly posed and is consistent historian.   LABORATORY, DIAGNOSTIC, AND  RADIOLOGICAL DATA: Admission BNP elevated at 11,881. BUN 54, creatinine 2.18, potassium 6.2, glucose 213. Lipase 110. AST 116, otherwise normal. CPK-MB elevated at 6.2. Troponin elevated at 3.7 to 3.0. TSH 1.57. WBC 25.4, hemoglobin 12.9, platelet count 198, MCV 103. Coags within normal. Urinalysis hazy, 3 WBCs, 1+ bacteria. Urine culture negative. Blood culture negative. Repeat laboratory studies today with hemoglobin 10.8.  PTT 85 on heparin. Blood cultures remained negative. Troponin still elevated at 3. WBC down 15.9, platelet count 184.   Single view chest x-ray 08/02/2011 with cardiomegaly, no acute abnormality.   Pelvis AP only showed degenerative changes of the lumbar spine and hips.   CT scan of the abdomen and pelvis with contrast performed 08/02/2011 showed fecal impaction. Small amount of pneumatosis in the rectum and distal sigmoid colon. Possible thickening of the wall of the proximal small bowel. No definite perforation or abscess. Atherosclerosis is present. Small amount of ascites is present. Trace pleural effusions are present with compressive atelectasis in the lower lobes.   Bilateral lower extremity Doppler study negative for DVT.   CT of the right hip performed outpatient 07/31/2011 showed evolving fecal impaction. Subchondral cystic change superior acetabular region of the right hip, could be degenerative. Fracture is less likely. Proximal femur appears intact. Minimal lucency seen in greater trochanter on the right superiorly which could represent a minimal fracture.   IMPRESSION: Complex medical patient who presents with left-sided chest pain, nausea, vomiting, dehydration, hypotension, leukocytosis, acute renal failure with hyperkalemia and has ruled in for myocardial infarction. CT scan of the abdomen showed fecal impaction, pneumatosis. Prior normal colonoscopy 2005. BNP was very elevated, lungs with crackles. The patient is not a luminal candidate at this time. Dr. Mechele CollinElliott performed rectal exam and she does have mushy brown stool without blood. No significant tenderness on exam. The patient denies abdominal pain, abdomen is soft, bowel sounds are present.   PLAN: Medical management with stool softeners and laxatives, empiric antibiotics. Dr. Mechele CollinElliott is not in favor of performing luminal evaluation at this time with recent MI. GI to follow the patient with you.  These services provided by  Cala BradfordKimberly A. Arvilla MarketMills, ANP under collaborative agreement with Lynnae Prudeobert Elliott, MD. ____________________________ Ranae PlumberKimberly A. Arvilla MarketMills, ANP kam:drc D: 08/03/2011 17:04:27 ET T: 08/04/2011 08:32:14 ET JOB#: [id number] cc: Cala BradfordKimberly A. Arvilla MarketMills, ANP, <Dictator>, Teena Iraniavid M. Terance HartBronstein, MD Ranae PlumberKimberly A. Suzette BattiestMills RN, MSN, ANP-BC Adult Nurse Practitioner ELECTRONICALLY SIGNED 08/05/2011 12:42

## 2014-08-25 NOTE — Consult Note (Signed)
PATIENT NAME:  Jasmine Berger, [name] MR#:  [id number] DATE OF BIRTH:  20-Jun-1924  DATE OF CONSULTATION:  08/06/2011  REFERRING PHYSICIAN:  Prime Doc CONSULTING PHYSICIAN:  Annice NeedyJason S. Dew, MD  REASON FOR CONSULTATION: Central line placement.   HISTORY OF PRESENT ILLNESS: This is an 81 year old female admitted with colitis and myocardial infarction who was critically ill in the Critical Care Unit. She has multiple ongoing issues. She is sedated and unable to provide review of systems. History is obtained from the previous medical record. She had an episode of nonverbal, not providing any history. She has lost her venous access and central line is requested.   PAST MEDICAL HISTORY:  1. Coronary artery disease.  2. Diabetes mellitus.  3. Hypertension.  4. Anxiety.  5. Status post pacemaker placement.  6. Depression.  7. Stroke.   PAST SURGICAL HISTORY:  1. Coronary artery bypass grafting.  2. Hemorrhoidectomy. 3. Hysterectomy.  4. Pacemaker placement.  5. Appendectomy.  6. Cholecystectomy.  7. Breast biopsy.  MEDICATIONS FROM THE HISTORY AND PHYSICAL INCLUDE:  1. Tylenol as needed.  2. Aripoprazole 10 mg at bedtime.  3. Aspirin 81 mg daily.  4. Budesonide/formoterol 80/4.5 mg one puff at bedtime.  5. Cholecalciferol 1000 units daily.  6. Clonazepam as needed for agitation.  7. Cyanocobalamin.  8. Stool softener.  9. Fentanyl patch for pain.  10. Hydrochlorothiazide.  11. Imdur 60 mg daily.  12. Lactulose 15 mL twice daily.  13. Lantus 32 units in morning, 16 units at night.  14. Levothyroxine 150 mcg daily.  15. Lisinopril 10 mg daily.  16. Linagliptin 5 mg daily.   17. Loratadine 10 mg daily.  18. Meloxicam 7.5 mg daily.  19. Metformin 500 mg b.i.d.  20. Novolin sliding scale.  21. Protonix 40 mg daily.  22. Potassium chloride 20 mEq daily.  23. Sertraline 100 mg daily. 24. Simvastatin 20 mg daily. 25. Trazodone 100 mg at bedtime.   ALLERGIES: Multiple and include  codeine, Inderal, Isoptin, Lotensin, penicillin, sulfa, and verapamil.   FAMILY HISTORY: Diabetes and heart disease in several family members.   SOCIAL HISTORY: She is widowed and  lives in a nursing home. No alcohol or tobacco abuse.   REVIEW OF SYSTEMS: Not obtainable at this time.   PHYSICAL EXAMINATION:  GENERAL: This is a critically ill-appearing female lying in the Critical Care Unit.   VITAL SIGNS: Temperature 99.3, pulse 75, blood pressure 179/78, and saturations are 99% on 2 liters.   HEENT: Normocephalic, atraumatic.   EYES: Sclerae anicteric. Conjunctivae are clear.   EARS: Normal external appearance. Hearing appears decreased.   HEART: Irregularly irregular.   LUNGS: Coarse with expiratory wheezes bilaterally.   ABDOMEN: Soft, nondistended. She has vague tenderness throughout.   EXTREMITIES: She has mild to moderate lower extremity edema. She has good capillary refill. Pedal pulses are not easily palpable.   NEUROLOGIC: Somnolent, not responding to questioning. Unable to assess strength or tone.   SKIN: Warm and dry.   LABORATORY EVALUATION: Sodium 144, potassium 2.4, chloride 105, CO2 31, BUN 8, creatinine 0.56, glucose 109. CBC from the day prior shows a white blood cell count of 15.9, hemoglobin 11.3, platelet count 222,000.   ASSESSMENT AND PLAN: This is an 81 year old female who is critically ill and needs a central line for venous access. This will be placed at the bedside. This is a level-3 consultation.   ____________________________ Annice NeedyJason S. Dew, MD jsd:ap D: 08/26/2011 12:22:41 ET T: 08/26/2011 13:12:18 ET JOB#: [id number]  cc: Annice Needy, MD, <Dictator> Annice Needy MD ELECTRONICALLY SIGNED 08/30/2011 16:37

## 2014-08-25 NOTE — Op Note (Signed)
PATIENT NAME:  Jasmine Berger, [name] MR#:  [id number] DATE OF BIRTH:  [birth year]  DATE OF PROCEDURE:  08/06/2011  PREOPERATIVE DIAGNOSES:  1. Poor venous access.  2. Colitis.  3. Myocardial infarction.   POSTOPERATIVE DIAGNOSES:  1. Poor venous access.  2. Colitis.  3. Myocardial infarction.   PROCEDURES:  1. Ultrasound guidance for vascular access, right jugular vein.  2. Placement of right jugular triple lumen catheter.   SURGEON: Annice NeedyJason S. Dew, MD  ANESTHESIA: Local.   ESTIMATED BLOOD LOSS: Minimal.   INDICATION FOR PROCEDURE: This is a critically ill 81 year old individual with limited access and multiple ongoing issues requiring IV medications. Central line is requested.   DESCRIPTION OF PROCEDURE: The patient was laid flat in his critical care bed. Right neck and chest sterilely prepped and draped, a sterile surgical field was created. Right jugular vein was visualized with ultrasound and found to be widely patent. It was then accessed under direct ultrasound guidance without difficulty with a Seldinger needle. J-wire was placed. After skin nick and dilatation, the triple lumen catheter was placed over the wire and the wire was removed and secured with three silk sutures at 19 cm to the skin. All three ports withdrew nonpulsatile dark red venous blood and flushed easily with sterile saline.   ____________________________ Annice NeedyJason S. Dew, MD jsd:cms D: 08/10/2011 11:39:00 ET T: 08/10/2011 12:54:50 ET  JOB#: [id number] Annice NeedyJASON S DEW MD ELECTRONICALLY SIGNED 08/12/2011 12:01

## 2014-08-25 NOTE — Consult Note (Signed)
Brief Consult Note: Diagnosis: Elevated trop, probable demand/supply ischemia, unlikely due to ACS.   Patient was seen by consultant.   Consult note dictated.   Comments: REC  Agree with current therapy, cont heparin drip for total of 48h, avoid risk for HIT, review echo, defer further noninvasive or invasive cardiac w/u in absence of CP.  Electronic Signatures: Marcina MillardParaschos, Jema Deegan (MD)  (Signed 02-Apr-13 18:26)  Authored: Brief Consult Note   Last Updated: 02-Apr-13 18:26 by Marcina MillardParaschos, Brittainy Bucker (MD)

## 2014-08-25 NOTE — Consult Note (Signed)
Comments   Dr Phifer and I met with pt's daughter, son, and grandaughter. Family updated on pt's status. They feel patient is not doing well despite continued medical meassures and is likely at end of life. We discussed options for continued attemps at disimpaction and GI workup vs comfort care. Family wants comfort care and [facility]. Will have Ginny Ward, liaison speak with family. Possibility of transfer today.  25 minutes  Electronic Signatures for Addendum Section:  Phifer, Izora Gala (MD) (Signed Addendum 09-Apr-13 22:31)  Billey Chang, NP, and I met with pt's son, daughter and granddaughter. Agree with assessment and plan as outlined in above note. Pt to transfer to the [facility] today.   Electronic Signatures: Barbarajean Kinzler, Kirt Boys (NP)  (Signed 09-Apr-13 12:00)  Authored: Palliative Care   Last Updated: 09-Apr-13 22:31 by Phifer, Izora Gala (MD)

## 2014-08-25 NOTE — Consult Note (Signed)
PATIENT NAME:  Jasmine, Berger MR#:  [id number] DATE OF BIRTH:  12-30-1924  DATE OF CONSULTATION:  08/02/2011  REFERRING PHYSICIAN:  Prime Doc  CONSULTING PHYSICIAN:  Carmie End, MD  PRIMARY CARE PHYSICIAN: Bethann Punches, MD  CHIEF COMPLAINT: Right hip pain.   BRIEF HISTORY: Jasmine Berger is an 81 year old woman, current resident of a nursing home, seen in the emergency room with complaints of nausea, vomiting, and mild diarrhea. The patient was seen 48 hours ago with  complaint of right hip pain and an extensive workup was undertaken in the emergency room which demonstrated no gross evidence for a hip fracture, but possible hairline fracture of the right hip on CT scan. She returned to the nursing home but continued to have difficulty with nausea and vomiting and returned to the emergency room this evening. She was found to have systemic inflammatory response with an elevated white blood cell count. Laboratory values revealed slightly elevated troponin and mild renal insufficiency. CT scan was performed which demonstrated a significant amount of stool in the colon and possible pneumatosis in the distal sigmoid and proximal rectum. The patient is being admitted to the medical service and the surgical service was consulted for evaluation of this possible GI distress.   She is a resident of a nursing home currently. In speaking with the family, she has had increasing confusion and dementia over the last several months. She has been declining both mentally and physically while in the nursing home. She has lived independently a number of years.   PAST MEDICAL HISTORY: She has multiple medical problems including high degree AV heart block treated with a pacemaker, diabetes, hypothyroidism, hypertension, hyperlipidemia, coronary artery disease, and marked sleep apnea.   PAST SURGICAL HISTORY: She has history of cholecystectomy, hysterectomy, appendectomy, breast biopsies, and coronary artery bypass.    MEDICATIONS/ALLERGIES: Current medications are outlined in her admission note, as are her  multiple allergies.   SOCIAL HISTORY: History is unremarkable. She is living in an assisted care living facility. She has no significant alcohol or tobacco usage.   REVIEW OF SYSTEMS: Review of systems is not possible with the patient in the current situation. She does not respond appropriately to questioning.   PHYSICAL EXAMINATION:   GENERAL: She is an older woman lying in bed and appears to be comfortable without significant complaints.   VITALS: Blood pressure is 168/84, heart rate is 94 and regular, and oxygen saturation is satisfactory.   HEENT: No scleral icterus or facial deformities. She does have equal and round pupils.   NECK: Supple without adenopathy and her trachea is midline.   LUNGS: Chest is clear, but she has very reduced breath sounds. No adventitious sounds are noted. She has normal pulmonary excursion.   CARDIAC: Very distant heart sounds. No murmurs or gallops are noted to my ear and she seems to be in normal sinus rhythm.   ABDOMEN: Mildly distended with well-healed right upper quadrant and lower midline scars. She is tympanitic with very few bowel sounds. She has no abdominal tenderness. No rebound, no guarding, no hernias, and no masses are noted.   MUSCULOSKELETAL: She has no hip tenderness on manipulation of the hip or palpation in the hip area. She has no suprapubic tenderness. She has decreased but palpable distal pulses. She has no lower extremity deformity and has reasonable range of motion.   PSYCHIATRIC: Exam is not possible in the current setting.   IMPRESSION AND RECOMMENDATIONS: I have independently reviewed her CT scan.  She does appear to be impacted fecally in the distal sigmoid and rectum and there is question of a small amount of pneumatosis. It is possible that she may have developed such significant constipation and some bowel distention that she has  developed some bowel ischemic change in the retroperitoneum and distal sigmoid. Such a problem could result in some mild hip pain and present as such. She is a very poor surgical candidate with multiple comorbidities and the situation with her mildly elevated troponin level. I have discussed the situation with the family in detail. This could easily be a life-threatening injury. I would treat it as you would diverticulitis with IV antibiotics, rehydration, and clinical support. The only surgical option might be diversion, but I do not see any indication for surgery at the present time. We will continue to follow her while she is hospitalized. This plan was outlined to the family in detail.  ____________________________ Carmie Endalph L. Ely III, MD rle:slb D: 08/02/2011 21:38:55 ET T: 08/03/2011 09:55:55 ET JOB#: [id number]  cc: Carmie Endalph L. Ely III, MD, <Dictator> Danella PentonMark F. Miller, MD Quentin OreALPH L ELY MD ELECTRONICALLY SIGNED 08/03/2011 19:24

## 2014-08-25 NOTE — Consult Note (Signed)
Pt denies any abd pain but is slightly tender in left lower area- localized.  Abd tympanitic and softly distended.  No new suggestions, no plans for colonoscopy.  Electronic Signatures: Scot JunElliott, Wynema Garoutte T (MD)  (Signed on 03-Apr-13 16:57)  Authored  Last Updated: 03-Apr-13 16:57 by Scot JunElliott, Myran Arcia T (MD)

## 2014-08-25 NOTE — Consult Note (Signed)
Asked by hospitalist to see patient again due to abd distention.  Exam shows increased distention with poor bowel sounds, not tender, patient denies abd pain,  She was noted to be picking at things not there and talking to people also not here when I walked into the room.  Given x-ray and exam she likely has ileus or intestinal pseudo obstruction.  Given overall health she is a poor candidate for any intervention. I would stop the Fentanyl patch, increase K and recheck magnesium with next blood draw.    Electronic Signatures: Scot JunElliott, Robert T (MD)  (Signed on 09-Apr-13 12:38)  Authored  Last Updated: 09-Apr-13 12:38 by Scot JunElliott, Robert T (MD)

## 2014-08-25 NOTE — Discharge Summary (Signed)
PATIENT NAME:  Jasmine Berger, Jasmine Berger DATE OF BIRTH:  03/27/1925  DATE OF ADMISSION:  08/02/2011 DATE OF DISCHARGE:  08/10/2011  TYPE OF DISCHARGE: The patient was transferred to the Hospice home on April 9th.   PRESENTING COMPLAINT: Abdominal pain and distention.   DISCHARGE DIAGNOSES:  1. Severe abdominal distention with ileus, possible megacolon.  2. Agitation, confusion with acute delirium.  3. NSTEMI due to supply/demand ischemia. Medical management.  4. Severe hypokalemia and hypomagnesemia.  5. Colitis.  6. Acute renal failure.   CONDITION ON DISCHARGE: Poor.   CODE STATUS: The patient is a NO CODE, DO NOT RESUSCITATE.   DISPOSITION: The patient is being discharged to Hospice home on comfort measures. She will continue Hospice home admit orders with Roxanol, OxyFAST, lorazepam, and ranitidine according to Hospice and admit orders.   CONSULTATIONS:  1. Dr. Mechele CollinElliott, GI  2. Dr. Michela PitcherEly and Dr. Egbert GaribaldiBird, Surgery  3. Dr. Wyn Quakerew, Vascular Surgery   4. Dr. Gwen PoundsKowalski, Cardiology   PROCEDURE: The patient underwent placement of a right internal jugular vein central line.   LABORATORY, DIAGNOSTIC, AND RADIOLOGICAL DATA: Glucose 198, BUN 3, creatinine 0.6, sodium 138, potassium 2.6, chloride 97, calcium 8.4, platelet count 285.   KUB shows gaseous distention of bowel loops primarily the colon but some loops of small bowel may be present. Degree of bowel distention is greater than noted in December of 2011.   BRIEF SUMMARY OF HOSPITAL COURSE: Please note H and P by Dr. Winona LegatoVaickute on admission. Jasmine Berger is an 81 year old Caucasian female with multiple medical problems who came to the Emergency Room with:  1. Severe abdominal distention with significant ileus. The patient was kept n.p.o. and started on IV fluids initially thought to be due to colitis, hence was started on IV Cipro and Flagyl. The patient's electrolytes had significant abnormality with hypokalemia and hypomagnesemia which  were changed and tried to be repleted IV and p.o. The patient continued to deteriorate. Her abdominal distention worsened although she was able to intermittently tolerate p.o. feeds. She was seen by Surgery, Dr. Michela PitcherEly, Dr. Excell Seltzerooper, and Dr. Egbert GaribaldiBird. She was not a surgical candidate given multiple comorbidities. GI, Dr. Mechele CollinElliott, saw the patient and recommended medical management with conservative treatment. Her fentanyl patch was discontinued. The patient was a very poor surgical candidate given comorbidities. She was not improving clinically, hence Palliative Care was consulted. After lengthy discussion with the patient's daughter, they agreed with COMFORT MEASURES since the patient's mentation was deteriorating as well. The patient was then transferred to Hospice home with comfort measures.  2. Agitation, confusion due to acute delirium.  3. NSTEMI likely due to supply/demand ischemia. Medical management was recommended per Cardiology.  4. Severe hypokalemia and hypomagnesemia. Levels improved slowly after IV and p.o. replacement.  5. Colitis. The patient remained on Cipro and Flagyl.  6. Acute renal failure, improved with IV fluids.    Hospital stay was prolonged, however, the patient did not improve and, hence was then transferred to Hospice home for comfort measures.   TIME SPENT: 40 minutes.   ____________________________ Wylie HailSona A. Allena KatzPatel, MD sap:drc D: 08/14/2011 18:22:14 ET T: 08/16/2011 13:26:50 ET JOB#: [id number]  cc: Lenoard Helbert A. Allena KatzPatel, MD, <Dictator> Willow OraSONA A Dawnyel Leven MD ELECTRONICALLY SIGNED 08/23/2011 13:20

## 2014-08-25 NOTE — Consult Note (Signed)
PATIENT NAME:  Jasmine Berger, [name] MR#:  [id number] DATE OF BIRTH:  09-11-1924  DATE OF CONSULTATION:  08/03/2011  REFERRING PHYSICIAN:   CONSULTING PHYSICIAN:  Marcina MillardAlexander Almarosa Bohac, MD  PRIMARY CARE PHYSICIAN:  Dr. Terance HartBronstein  CHIEF COMPLAINT:  Right hip pain.  HISTORY OF PRESENT ILLNESS: The patient is an 81 year old female with known history of coronary artery disease status post prior bypass graft surgery and pacemaker. The patient currently is a resident at [facility]. According to workers at [facility] the patient was experiencing nausea and vomiting two days prior to admission. The patient also fell and hurt her right hip with possible fracture. She was [facility] to [facility] Room with recurrent nausea and vomiting. Upon presentation the patient was hypotensive with a blood pressure of 99/50 as well as hypoxic. She had left lower quadrant discomfort.  Admission labs were notable for markedly elevated white count of 24,000. The patient also demonstrated acute on chronic renal failure with a BUN and creatinine 54 and 2.18, respectively. Troponin was elevated to 1.64. The patient was admitted to telemetry where her troponin has peaked to 3.7. Total CPK and MB were 260 and 8.1, respectively. The patient denies chest pain. EKG was nondiagnostic.   PAST MEDICAL HISTORY:  1. Coronary artery bypass graft surgery.  2. Status post pacemaker.  3. Type 2 diabetes.  4. Hypertension.  5. Hyperlipidemia.  6. Anxiety and depression.  7. Hypothyroidism.  8. History of recurrent falls.  9. History of sleep apnea, on CPAP.   MEDICATIONS:  1. Aspirin 81 mg daily.  2. Hydrochlorothiazide 25 mg daily.  3. Isosorbide mononitrate 60 mg daily.  4. Lisinopril 10 mg daily. A 5. Acetaminophen p.r.n.  6. Aripiprazole 10 mg at bedtime.  7. Cholecalciferol 1000 units daily.  8. Clonazepam 0.5 mg q. 8 p.r.n.  9. Cyanocobalamin 500 mg daily. 10. Docusate 50/8.6, 1 daily.   11. Fentanyl patch every 72 hours.  12. Lactulose 15 mL b.i.d.  13. Lantus insulin 32 units q. a.m. and 16 units at bedtime.  14. Levothyroxine 150 mcg daily.  15. Loratadine 10 mg daily.  16. Meloxicam 75 mg daily.  17. Metformin 500 mg b.i.d. 18. Novolin R sliding scale.  19. Pantoprazole 40 mg daily.  20. Potassium chloride 20 mEq daily.  21. Sertraline 100 mg daily.  22. Simvastatin 20 mg daily.  23. Trazodone 100 mg at bedtime.   SOCIAL HISTORY: The patient is a widow. She is currently at a skilled nursing facility. She denies tobacco or EtOH abuse.   FAMILY HISTORY: Father died status post myocardial infarction at age 79.  REVIEW OF SYSTEMS:  CONSTITUTIONAL: The patient denies fever or chills. She does have some generalized fatigue. EYES: Denies blurry vision. EARS: Denies hearing loss. RESPIRATORY: The patient denies shortness of breath. CARDIOVASCULAR: The patient denies chest pain. GI: The patient denies nausea, vomiting, diarrhea, or constipation. GU: The patient denies dysuria, hematuria. ENDOCRINE: The patient denies polyuria or polydipsia. MUSCULOSKELETAL: The patient denies arthralgias or myalgias. NEUROLOGICAL: The patient denies any focal muscle weakness or numbness. PSYCHOLOGICAL: The patient does have depression, anxiety.   PHYSICAL EXAMINATION:  VITAL SIGNS: Blood pressure 123/66, pulse 70, respirations 18, temperature 99.8, pulse oximetry 98%.   HEENT: Pupils equal, reactive to light and accommodation.   NECK: Supple without thyromegaly.   LUNGS: Clear.   HEART: Normal jugular venous pressure. Normal point of maximal impulse. Regular rate and rhythm. Normal S1, S2. No appreciable gallop, murmur, or rub.  ABDOMEN: Soft and nontender.   EXTREMITIES: Pulses were intact bilaterally. The patient had excoriations of both upper arms.   MUSCULOSKELETAL: Normal muscle tone.   NEUROLOGIC: The patient is alert and oriented but had poor insight. Motor and sensory both  grossly intact.   IMPRESSION: This is an 81 year old female who presents with dehydration, acute on chronic renal failure, vague constellation of complaints including lower abdominal discomfort, right hip pain, and apparent nausea and vomiting. The patient did have elevated troponin in the absence of chest pain or ECG changes. The patient had markedly elevated white count. Elevated troponin is likely due to supply/demand ischemia in light of all the other abnormal lab values.   RECOMMENDATIONS:  1. Agree with overall current therapy.  2. Would continue heparin only for a total of 48 hours.  3. Review 2-D echocardiogram.  4. Would defer any further noninvasive or invasive cardiac evaluation at this time unless the patient demonstrates evidence for possible myocardial ischemia.  ____________________________ Marcina Millard, MD ap:bjt D: 08/03/2011 18:24:33 ET T: 08/04/2011 09:50:21 ET JOB#: [id number]  Lyn Hollingshead Remingtyn Depaola MD ELECTRONICALLY SIGNED 08/18/2011 8:46
